# Patient Record
Sex: Male | Born: 1965 | Race: White | Hispanic: No | Marital: Married | Smoking: Former smoker
Health system: Southern US, Community
[De-identification: ages and names within clinical notes are randomized; demographics above are authoritative.]

## PROBLEM LIST (undated history)

## (undated) DIAGNOSIS — N390 Urinary tract infection, site not specified: Secondary | ICD-10-CM

## (undated) DIAGNOSIS — M1990 Unspecified osteoarthritis, unspecified site: Secondary | ICD-10-CM

## (undated) DIAGNOSIS — R197 Diarrhea, unspecified: Secondary | ICD-10-CM

## (undated) DIAGNOSIS — Z87442 Personal history of urinary calculi: Secondary | ICD-10-CM

## (undated) DIAGNOSIS — J189 Pneumonia, unspecified organism: Secondary | ICD-10-CM

## (undated) DIAGNOSIS — C189 Malignant neoplasm of colon, unspecified: Secondary | ICD-10-CM

## (undated) DIAGNOSIS — K449 Diaphragmatic hernia without obstruction or gangrene: Secondary | ICD-10-CM

## (undated) DIAGNOSIS — K219 Gastro-esophageal reflux disease without esophagitis: Secondary | ICD-10-CM

## (undated) DIAGNOSIS — Z9889 Other specified postprocedural states: Secondary | ICD-10-CM

## (undated) HISTORY — DX: Personal history of urinary calculi: Z87.442

## (undated) HISTORY — DX: Unspecified osteoarthritis, unspecified site: M19.90

## (undated) HISTORY — PX: COLONOSCOPY W/ POLYPECTOMY: SHX1380

## (undated) HISTORY — DX: Other specified postprocedural states: Z98.890

## (undated) HISTORY — DX: Gastro-esophageal reflux disease without esophagitis: K21.9

## (undated) HISTORY — DX: Diaphragmatic hernia without obstruction or gangrene: K44.9

## (undated) HISTORY — PX: SKIN CANCER EXCISION: SHX779

## (undated) HISTORY — DX: Urinary tract infection, site not specified: N39.0

## (undated) HISTORY — DX: Pneumonia, unspecified organism: J18.9

## (undated) HISTORY — DX: Diarrhea, unspecified: R19.7

## (undated) HISTORY — DX: Malignant neoplasm of colon, unspecified: C18.9

---

## 2007-02-11 ENCOUNTER — Encounter: Admission: RE | Admit: 2007-02-11 | Discharge: 2007-02-11 | Payer: Self-pay | Admitting: Orthopedic Surgery
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
## 2018-05-29 ENCOUNTER — Ambulatory Visit (INDEPENDENT_AMBULATORY_CARE_PROVIDER_SITE_OTHER): Payer: Self-pay

## 2018-05-29 ENCOUNTER — Ambulatory Visit (INDEPENDENT_AMBULATORY_CARE_PROVIDER_SITE_OTHER): Payer: Self-pay | Admitting: Orthopedic Surgery

## 2018-05-29 ENCOUNTER — Encounter (INDEPENDENT_AMBULATORY_CARE_PROVIDER_SITE_OTHER): Payer: Self-pay | Admitting: Orthopedic Surgery

## 2018-05-29 DIAGNOSIS — M25512 Pain in left shoulder: Secondary | ICD-10-CM

## 2018-05-29 DIAGNOSIS — M25511 Pain in right shoulder: Secondary | ICD-10-CM

## 2018-05-29 DIAGNOSIS — M7551 Bursitis of right shoulder: Secondary | ICD-10-CM

## 2018-06-02 ENCOUNTER — Encounter (INDEPENDENT_AMBULATORY_CARE_PROVIDER_SITE_OTHER): Payer: Self-pay | Admitting: Orthopedic Surgery

## 2018-06-02 DIAGNOSIS — M7551 Bursitis of right shoulder: Secondary | ICD-10-CM

## 2018-06-02 MED ORDER — LIDOCAINE HCL 1 % IJ SOLN
5.0000 mL | INTRAMUSCULAR | Status: AC | PRN
  Administered 2018-06-02: 5 mL

## 2018-06-02 MED ORDER — METHYLPREDNISOLONE ACETATE 40 MG/ML IJ SUSP
40.0000 mg | INTRAMUSCULAR | Status: AC | PRN
  Administered 2018-06-02: 40 mg via INTRA_ARTICULAR

## 2018-06-02 MED ORDER — BUPIVACAINE HCL 0.5 % IJ SOLN
9.0000 mL | INTRAMUSCULAR | Status: AC | PRN
  Administered 2018-06-02: 9 mL via INTRA_ARTICULAR

## 2018-06-02 NOTE — Progress Notes (Signed)
Office Visit Note   Patient: Robert Mitchell           Date of Birth: 07/22/1965           MRN: [id number] Visit Date: 05/29/2018 Requested by: No referring provider defined for this encounter. PCP: Hadley Pen, MD  Subjective: Chief Complaint  Patient presents with  . Right Shoulder - Pain  . Left Shoulder - Pain    HPI: Robert Mitchell is a patient with bilateral shoulder pain right worse than left.  Denies a history of injury.  He has had it for long time.  Had cortisone injection in the subacromial space 12 years ago which helped him.  He reports some popping and occasional radiation of pain below the elbow but no discrete radicular symptoms from the neck.  Hurts for him to reach behind his back.  He is not taking any pain medicine for the problem.              ROS: All systems reviewed are negative as they relate to the chief complaint within the history of present illness.  Patient denies  fevers or chills.   Assessment & Plan: Visit Diagnoses:  1. Acute pain of left shoulder   2. Acute pain of right shoulder     Plan: Impression is bilateral shoulder pain right worse than left..  No evidence of rotator cuff weakness or frozen shoulder.  Impingement likely.  Radiographs normal.  Plan is subacromial injection today with follow-up in 6 to 8 weeks if no better.  Could consider MRI scanning at that time.  Follow-Up Instructions: Return if symptoms worsen or fail to improve.   Orders:  Orders Placed This Encounter  Procedures  . XR Shoulder Right  . XR Shoulder Left   No orders of the defined types were placed in this encounter.     Procedures: Large Joint Inj: R subacromial bursa on 06/02/2018 4:23 PM Indications: diagnostic evaluation and pain Details: 18 G 1.5 in needle, posterior approach  Arthrogram: No  Medications: 9 mL bupivacaine 0.5 %; 40 mg methylPREDNISolone acetate 40 MG/ML; 5 mL lidocaine 1 % Outcome: tolerated well, no immediate complications Procedure,  treatment alternatives, risks and benefits explained, specific risks discussed. Consent was given by the patient. Immediately prior to procedure a time out was called to verify the correct patient, procedure, equipment, support staff and site/side marked as required. Patient was prepped and draped in the usual sterile fashion.       Clinical Data: No additional findings.  Objective: Vital Signs: There were no vitals taken for this visit.  Physical Exam:   Constitutional: Patient appears well-developed HEENT:  Head: Normocephalic Eyes:EOM are normal Neck: Normal range of motion Cardiovascular: Normal rate Pulmonary/chest: Effort normal Neurologic: Patient is alert Skin: Skin is warm Psychiatric: Patient has normal mood and affect    Ortho Exam: Ortho exam demonstrates full active and passive range of motion of both shoulders.  Impingement signs positive on the right and left.  Good rotator cuff strength isolated Espina supraspinatus subscap muscle testing.  No masses lymphadenopathy or skin change in the shoulder girdle region.  Cervical spine range of motion is full.  No paresthesias C5-T1.  Reflexes symmetric and radial pulses intact.  Specialty Comments:  No specialty comments available.  Imaging: No results found.   PMFS History: There are no active problems to display for this patient.  History reviewed. No pertinent past medical history.  History reviewed. No pertinent family history.  History reviewed.  No pertinent surgical history. Social History   Occupational History  . Not on file  Tobacco Use  . Smoking status: Never Smoker  . Smokeless tobacco: Current User    Types: Chew  Substance and Sexual Activity  . Alcohol use: Not on file  . Drug use: Not on file  . Sexual activity: Not on file

## 2018-08-07 ENCOUNTER — Telehealth (INDEPENDENT_AMBULATORY_CARE_PROVIDER_SITE_OTHER): Payer: Self-pay | Admitting: Orthopedic Surgery

## 2018-08-07 NOTE — Telephone Encounter (Signed)
Can you help with this?

## 2018-08-07 NOTE — Telephone Encounter (Signed)
Patient's wife called yesterday and stated that they received a bill that he was seen two different times.  One office visit on 05/29/18 and 06/02/2018.  Billing is charging them two different office visits instead of just the one for 05/29/2018.  Billing advised them to call our office to get the dates corrected.  [phone].  Thank you.

## 2018-08-08 ENCOUNTER — Telehealth (INDEPENDENT_AMBULATORY_CARE_PROVIDER_SITE_OTHER): Payer: Self-pay

## 2018-08-08 DIAGNOSIS — M25512 Pain in left shoulder: Secondary | ICD-10-CM

## 2018-08-08 NOTE — Telephone Encounter (Signed)
Patients wife called stating patient is having increased pain. Previously had shoulder asp/injected in January. Wants to know what next step is? She wanted to know if there is any suggestion of things that he could use to get relief. He is not sleeping at night. Pain worse over the last couple of weeks Contact number  [phone]

## 2018-08-12 NOTE — Telephone Encounter (Signed)
Needs MRI arthrogram on the affected shoulder.  Please call to arrange.  Thanks

## 2018-08-12 NOTE — Telephone Encounter (Signed)
Put order in for scan Patients wife aware will be contacted to get scheduled. Advised may not be scheduled for a few weeks due to virus but order had been put in.

## 2018-08-12 NOTE — Addendum Note (Signed)
Addended byPrescott Parma on: 08/12/2018 02:21 PM   Modules accepted: Orders

## 2018-08-23 ENCOUNTER — Other Ambulatory Visit (INDEPENDENT_AMBULATORY_CARE_PROVIDER_SITE_OTHER): Payer: Self-pay | Admitting: Orthopedic Surgery

## 2018-08-23 DIAGNOSIS — Z77018 Contact with and (suspected) exposure to other hazardous metals: Secondary | ICD-10-CM

## 2018-10-08 ENCOUNTER — Ambulatory Visit
Admission: RE | Admit: 2018-10-08 | Discharge: 2018-10-08 | Disposition: A | Discharge status: Home / Self Care | Payer: Self-pay | Source: Ambulatory Visit | Attending: Orthopedic Surgery | Admitting: Orthopedic Surgery

## 2018-10-08 ENCOUNTER — Other Ambulatory Visit: Payer: Self-pay

## 2018-10-08 DIAGNOSIS — M25512 Pain in left shoulder: Secondary | ICD-10-CM

## 2018-10-08 DIAGNOSIS — Z77018 Contact with and (suspected) exposure to other hazardous metals: Secondary | ICD-10-CM

## 2018-11-05 ENCOUNTER — Ambulatory Visit
Admission: RE | Admit: 2018-11-05 | Discharge: 2018-11-05 | Disposition: A | Discharge status: Home / Self Care | Payer: Self-pay | Source: Ambulatory Visit | Attending: Orthopedic Surgery | Admitting: Orthopedic Surgery

## 2018-11-05 ENCOUNTER — Other Ambulatory Visit (INDEPENDENT_AMBULATORY_CARE_PROVIDER_SITE_OTHER): Payer: Self-pay | Admitting: Orthopedic Surgery

## 2018-11-05 ENCOUNTER — Other Ambulatory Visit: Payer: Self-pay

## 2018-11-05 DIAGNOSIS — M25512 Pain in left shoulder: Secondary | ICD-10-CM

## 2018-11-05 DIAGNOSIS — M25511 Pain in right shoulder: Secondary | ICD-10-CM

## 2018-11-05 MED ORDER — IOPAMIDOL (ISOVUE-M 200) INJECTION 41%
12.0000 mL | Freq: Once | INTRAMUSCULAR | Status: AC
  Administered 2018-11-05: 17:00:00 12 mL via INTRA_ARTICULAR

## 2018-11-22 ENCOUNTER — Telehealth: Payer: Self-pay | Admitting: Orthopedic Surgery

## 2018-11-22 NOTE — Telephone Encounter (Signed)
Please advise thanks.

## 2018-11-22 NOTE — Telephone Encounter (Signed)
Patient's spouse called very upset that patient has not been called about MRI results. Advised spouse that our normal protocol is telling the patient at check out to call us back after given a date to schedule a MRI REVIEW. She said Dr. Marlou Sa has always called them with results.  Please call this p[atient to advise what to do about the results. I apologized to spouse for the delay of results.  Patients # [id number] or [phone]

## 2018-11-25 NOTE — Telephone Encounter (Signed)
I called and left a message on the spouse's number but the [phone] number has some type of weird mailbox thing that I could not decipher

## 2019-01-29 ENCOUNTER — Ambulatory Visit: Payer: Self-pay | Admitting: Orthopedic Surgery

## 2019-01-30 ENCOUNTER — Ambulatory Visit (INDEPENDENT_AMBULATORY_CARE_PROVIDER_SITE_OTHER): Payer: Self-pay | Admitting: Orthopedic Surgery

## 2019-01-30 ENCOUNTER — Other Ambulatory Visit: Payer: Self-pay

## 2019-01-30 ENCOUNTER — Encounter: Payer: Self-pay | Admitting: Orthopedic Surgery

## 2019-01-30 ENCOUNTER — Telehealth: Payer: Self-pay

## 2019-01-30 DIAGNOSIS — M19012 Primary osteoarthritis, left shoulder: Secondary | ICD-10-CM

## 2019-01-30 DIAGNOSIS — M19011 Primary osteoarthritis, right shoulder: Secondary | ICD-10-CM

## 2019-01-30 MED ORDER — MELOXICAM 15 MG PO TABS
15.0000 mg | ORAL_TABLET | Freq: Every day | ORAL | 0 refills | Status: DC
Start: 2019-01-30 — End: 2019-02-23

## 2019-01-30 NOTE — Telephone Encounter (Signed)
Patient was seen today. He mentioned that at his last OV he was charged for ultrasound guided injection. Can you please check and see if this is correct?

## 2019-01-31 ENCOUNTER — Encounter: Payer: Self-pay | Admitting: Orthopedic Surgery

## 2019-01-31 NOTE — Progress Notes (Signed)
Office Visit Note   Patient: Robert Mitchell           Date of Birth: 04/14/1966           MRN: [id number] Visit Date: 01/30/2019 Requested by: Myrlene Broker, MD [place],   [id number] PCP: Myrlene Broker, MD  Subjective: No chief complaint on file.   HPI: Robert Mitchell is a 53 y.o. male who presents to the office complaining of right shoulder pain.  He returns to discuss MRI results.  He injured the right shoulder months ago when he fell onto it.  He had no right shoulder pain prior to the injury.  Patient notes decreased range of motion, weakness.  He notes diffuse right shoulder pain that radiates to the elbow but never to the fingers.  Pain wakes him up at night and he is trying to control the pain with ibuprofen and Tylenol.  He denies any neck pain, numbness tingling, burning.  He also notes clicking and grinding of the shoulder with range of motion.  He has had 1 recent injection into the subacromial space of the right shoulder about 6 months ago that provided no relief.  He also notes about 6 months of pain in the left shoulder with similar symptoms.  He denies any injury but states it was an acute onset.              ROS:  All systems reviewed are negative as they relate to the chief complaint within the history of present illness.  Patient denies fevers or chills.  Assessment & Plan: Visit Diagnoses:  1. Primary osteoarthritis, right shoulder   2. Primary osteoarthritis, left shoulder     Plan: Patient is a 53 year old male who presents complaining of bilateral shoulder pain, right greater than left.  He returns to discuss MRI results.  MRI of the right shoulder on 11/05/2018 reveals mild to moderate rotator cuff tendinopathy with no partial or full thickness tear, posterior labral tear, degenerative superior labrum, advanced glenohumeral joint degeneration, with intact long head biceps tendon and glenohumeral ligaments.  Discussed options available  to the patient.  Patient is likely heading for a shoulder replacement later in his lifetime.  He has excellent strength and good range of motion passively on exam aside from external rotation which is limited.  Prescribed meloxicam.  Patient will follow-up with Dr. Junius Roads in about a week for bilateral shoulder injections into the glenohumeral joint itself.  Patient agrees with the plan.  Follow-Up Instructions: No follow-ups on file.   Orders:  No orders of the defined types were placed in this encounter.  Meds ordered this encounter  Medications  . meloxicam (MOBIC) 15 MG tablet    Sig: Take 1 tablet (15 mg total) by mouth daily.    Dispense:  30 tablet    Refill:  0      Procedures: No procedures performed   Clinical Data: No additional findings.  Objective: Vital Signs: There were no vitals taken for this visit.  Physical Exam:  Constitutional: Patient appears well-developed HEENT:  Head: Normocephalic Eyes:EOM are normal Neck: Normal range of motion Cardiovascular: Normal rate Pulmonary/chest: Effort normal Neurologic: Patient is alert Skin: Skin is warm Psychiatric: Patient has normal mood and affect  Ortho Exam:  Bilateral shoulder Exam Able to fully forward flex and abduct shoulder overhead with pain Significant bilateral reduction of external rotation range of motion.  Good endpoint with ER No TTP over the [facility]  joint Mild TTP over the bicipital groove 5/5 motor strength of the subscapularis, supraspinatus, infraspinatus muscles Negative Hawkins impingement 5/5 grip strength, forearm pronation/supination, and bicep strength  Specialty Comments:  No specialty comments available.  Imaging: No results found.   PMFS History: There are no active problems to display for this patient.  History reviewed. No pertinent past medical history.  History reviewed. No pertinent family history.  History reviewed. No pertinent surgical history. Social History    Occupational History  . Not on file  Tobacco Use  . Smoking status: Never Smoker  . Smokeless tobacco: Current User    Types: Chew  Substance and Sexual Activity  . Alcohol use: Not on file  . Drug use: Not on file  . Sexual activity: Not on file

## 2019-01-31 NOTE — Telephone Encounter (Signed)
Okay.  Thanks for checking into it.  I asked him to bring his bill in so we can look at it.  There is definitely a discrepancy between what was charged and what is in the notes according to him.

## 2019-01-31 NOTE — Telephone Encounter (Signed)
See note from Abigail Butts she looked into and according to charges, there was no charge for ultrasound.  IC LMVM for patient advising him of this as well.

## 2019-01-31 NOTE — Telephone Encounter (Signed)
His charges from Jan 2020 were-  [id number]  [id number]  [id number]  [id number] x 2  There was no ultrasound charged.

## 2019-02-07 ENCOUNTER — Ambulatory Visit: Payer: Self-pay

## 2019-02-07 ENCOUNTER — Encounter: Payer: Self-pay | Admitting: Family Medicine

## 2019-02-07 ENCOUNTER — Other Ambulatory Visit: Payer: Self-pay

## 2019-02-07 ENCOUNTER — Ambulatory Visit (INDEPENDENT_AMBULATORY_CARE_PROVIDER_SITE_OTHER): Payer: Self-pay | Admitting: Family Medicine

## 2019-02-07 DIAGNOSIS — M19011 Primary osteoarthritis, right shoulder: Secondary | ICD-10-CM

## 2019-02-07 DIAGNOSIS — M19012 Primary osteoarthritis, left shoulder: Secondary | ICD-10-CM

## 2019-02-07 NOTE — Progress Notes (Signed)
Subjective: Patient is here for ultrasound-guided intra-articular bilateral glenohumeral injection.  He has advanced glenohumeral DJD.  Minimal improvement with subacromial injection.  Objective: He has pain at the extremes of range of motion in both shoulders.  Procedure: Ultrasound-guided bilateral glenohumeral injection: After sterile prep with Betadine, injected 8 cc 1% lidocaine without epinephrine and 40 mg methylprednisolone using a 22-gauge spinal needle, passing the needle through approach into the glenohumeral joint.  Injectate was seen filling the joint capsules.  He had very good immediate pain relief as well as improved range of motion.  He will follow-up with Dr. Marlou Sa as directed.  Future options could include gel injections, PRP, prolotherapy with dextrose.

## 2019-02-23 ENCOUNTER — Other Ambulatory Visit: Payer: Self-pay | Admitting: Surgical

## 2019-02-23 NOTE — Telephone Encounter (Signed)
Ok to rf? 

## 2019-03-25 ENCOUNTER — Other Ambulatory Visit: Payer: Self-pay | Admitting: Surgical

## 2019-03-25 NOTE — Telephone Encounter (Signed)
Please advise, thank you.

## 2019-04-08 ENCOUNTER — Other Ambulatory Visit: Payer: Self-pay

## 2019-04-08 ENCOUNTER — Encounter: Payer: Self-pay | Admitting: Family Medicine

## 2019-04-08 ENCOUNTER — Ambulatory Visit (INDEPENDENT_AMBULATORY_CARE_PROVIDER_SITE_OTHER): Payer: Self-pay | Admitting: Family Medicine

## 2019-04-08 DIAGNOSIS — M19012 Primary osteoarthritis, left shoulder: Secondary | ICD-10-CM

## 2019-04-08 DIAGNOSIS — M19011 Primary osteoarthritis, right shoulder: Secondary | ICD-10-CM

## 2019-04-08 NOTE — Progress Notes (Signed)
   Office Visit Note   Patient: Robert Mitchell           Date of Birth: Dec 12, 1965           MRN: [id number] Visit Date: 04/08/2019 Requested by: Myrlene Broker, MD [place],  [place] [address] PCP: Myrlene Broker, MD  Subjective: Chief Complaint  Patient presents with  . Right Shoulder - Pain    Wants to discuss options for the shoulders. Only had 2 days' relief with the glenohumeral injections on 02/07/19.  Marland Kitchen Left Shoulder - Pain    HPI: He is here for follow-up bilateral shoulder glenohumeral DJD.  Intra-articular injections gave only 2 days relief.  He does not have health insurance.  He recently met with a local clinic that provides stem cell injections.  He wanted to get my opinion on this.               ROS:   All other systems were reviewed and are negative.  Objective: Vital Signs: There were no vitals taken for this visit.  Physical Exam:  General:  Alert and oriented, in no acute distress. Pulm:  Breathing unlabored. Psy:  Normal mood, congruent affect.  No exam done today.  Imaging: None today  Assessment & Plan: 1.  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  We do not offer stem cell injections because studies have not proven their effectiveness.  Regardless, he thinks he plans to try them. -We discussed other options including hyaluronic acid injections and prolotherapy with dextrose. -I will see him back as needed.     Procedures: No procedures performed  No notes on file     PMFS History: There are no active problems to display for this patient.  History reviewed. No pertinent past medical history.  History reviewed. No pertinent family history.  History reviewed. No pertinent surgical history. Social History   Occupational History  . Not on file  Tobacco Use  . Smoking status: Never Smoker  . Smokeless tobacco: Current User    Types: Chew   Substance and Sexual Activity  . Alcohol use: Not on file  . Drug use: Not on file  . Sexual activity: Not on file

## 2019-04-23 ENCOUNTER — Other Ambulatory Visit: Payer: Self-pay | Admitting: Surgical

## 2019-04-23 NOTE — Telephone Encounter (Signed)
This is a MH pt.  

## 2019-05-20 ENCOUNTER — Other Ambulatory Visit: Payer: Self-pay | Admitting: Family Medicine

## 2020-06-18 IMAGING — XA FLUORO GUIDED NEEDLE PLACEMENT AND/OR ASPIRATION
1 series · 1 of 1 positions shown · non-contrast
Comparison: none

CLINICAL DATA: Right shoulder pain.

[Series 1: ortho standard · 1 of 1 slices shown]
[im 1/1]
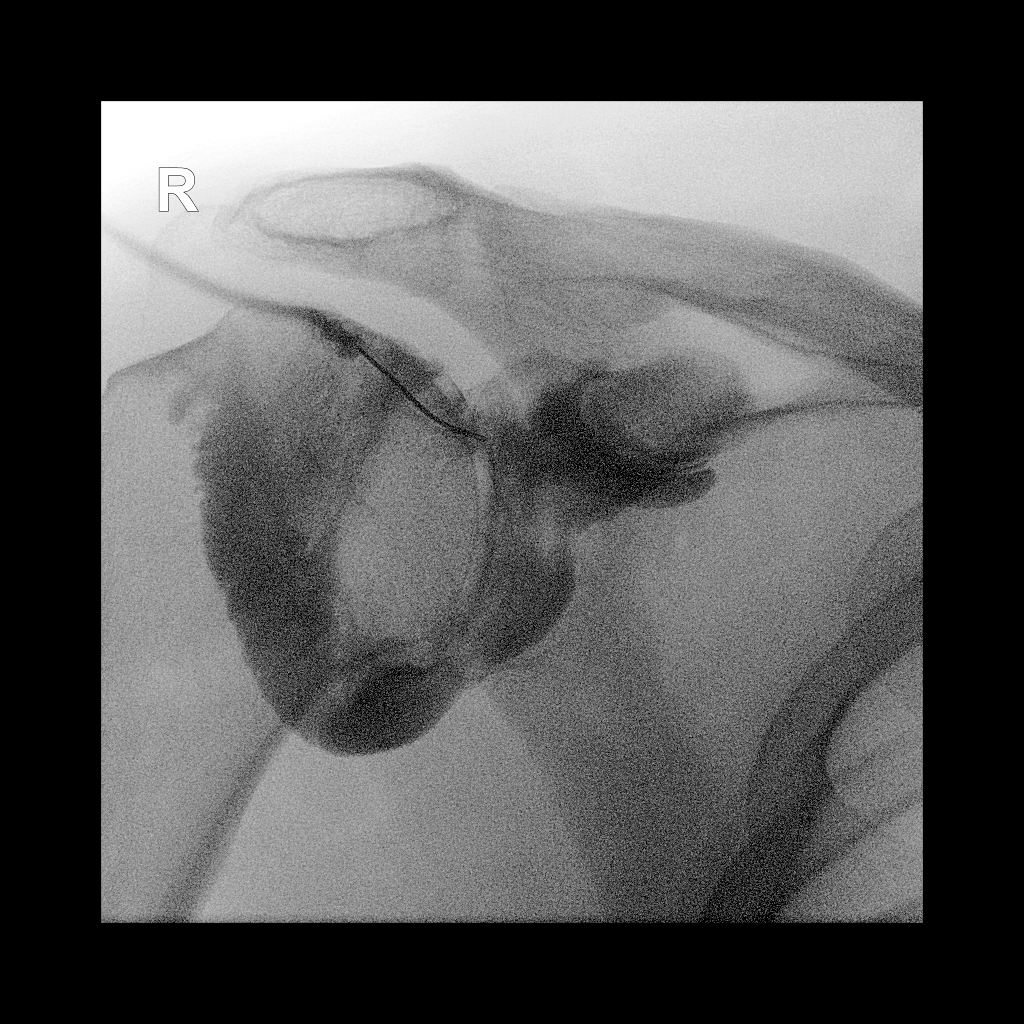

[1 of 1 positions shown; findings below may reference images not displayed]

FLUOROSCOPY TIME:  Radiation Exposure Index (as provided by the
fluoroscopic device): 0.6 mGy

Fluoroscopy Time:  4 seconds

Number of Acquired Images:  0

PROCEDURE:
The risks and benefits of the procedure were discussed with the
patient, and written informed consent was obtained. The patient
stated no history of allergy to contrast media. A formal timeout
procedure was performed with the patient according to departmental
protocol.

The patient was placed supine on the fluoroscopy table and the right
glenohumeral joint was identified under fluoroscopy. The skin
overlying the right glenohumeral joint was subsequently cleaned with
Betadine and a sterile drape was placed over the area of interest. 2
ml 1% Lidocaine was used to anesthetize the skin around the needle
insertion site.

A 22 gauge spinal needle was inserted into the right glenohumeral
joint under fluoroscopy.

12 ml of gadolinium mixture (0.1 ml of Multihance mixed with 10 ml
of Isovue-M 200 contrast and 10 ml of sterile saline) were injected
into the right glenohumeral joint.

The needle was removed and hemostasis was achieved. The patient was
subsequently transferred to MRI for imaging.
IMPRESSION: Technically successful right shoulder injection for MRI.

## 2020-07-16 ENCOUNTER — Other Ambulatory Visit: Payer: Self-pay | Admitting: Family Medicine

## 2020-08-16 ENCOUNTER — Other Ambulatory Visit: Payer: Self-pay | Admitting: Family Medicine

## 2023-10-15 ENCOUNTER — Other Ambulatory Visit (INDEPENDENT_AMBULATORY_CARE_PROVIDER_SITE_OTHER): Payer: Self-pay

## 2023-10-15 ENCOUNTER — Encounter: Payer: Self-pay | Admitting: Physician Assistant

## 2023-10-15 ENCOUNTER — Ambulatory Visit: Payer: Self-pay | Admitting: Physician Assistant

## 2023-10-15 VITALS — BP 120/80 | HR 90 | Ht 67.0 in | Wt 183.4 lb

## 2023-10-15 DIAGNOSIS — Z800 Family history of malignant neoplasm of digestive organs: Secondary | ICD-10-CM

## 2023-10-15 DIAGNOSIS — R197 Diarrhea, unspecified: Secondary | ICD-10-CM

## 2023-10-15 DIAGNOSIS — D509 Iron deficiency anemia, unspecified: Secondary | ICD-10-CM

## 2023-10-15 DIAGNOSIS — Z83719 Family history of colon polyps, unspecified: Secondary | ICD-10-CM

## 2023-10-15 DIAGNOSIS — R159 Full incontinence of feces: Secondary | ICD-10-CM

## 2023-10-15 DIAGNOSIS — K921 Melena: Secondary | ICD-10-CM

## 2023-10-15 DIAGNOSIS — K625 Hemorrhage of anus and rectum: Secondary | ICD-10-CM

## 2023-10-15 LAB — COMPREHENSIVE METABOLIC PANEL WITH GFR
ALT: 7 U/L (ref 0–53)
AST: 9 U/L (ref 0–37)
Albumin: 4.1 g/dL (ref 3.5–5.2)
Alkaline Phosphatase: 54 U/L (ref 39–117)
BUN: 12 mg/dL (ref 6–23)
CO2: 30 meq/L (ref 19–32)
Calcium: 9.1 mg/dL (ref 8.4–10.5)
Chloride: 103 meq/L (ref 96–112)
Creatinine, Ser: 1.16 mg/dL (ref 0.40–1.50)
GFR: 70.37 mL/min (ref >60.00)
Glucose, Bld: 86 mg/dL (ref 70–99)
Potassium: 3.8 meq/L (ref 3.5–5.1)
Sodium: 139 meq/L (ref 135–145)
Total Bilirubin: 0.4 mg/dL (ref 0.2–1.2)
Total Protein: 6.9 g/dL (ref 6.0–8.3)

## 2023-10-15 LAB — CBC WITH DIFFERENTIAL/PLATELET
Basophils Absolute: 0 10*3/uL (ref 0.0–0.1)
Basophils Relative: 0.6 % (ref 0.0–3.0)
Eosinophils Absolute: 0.1 10*3/uL (ref 0.0–0.7)
Eosinophils Relative: 2 % (ref 0.0–5.0)
HCT: 44.5 % (ref 39.0–52.0)
Hemoglobin: 14.5 g/dL (ref 13.0–17.0)
Lymphocytes Relative: 30.6 % (ref 12.0–46.0)
Lymphs Abs: 2 10*3/uL (ref 0.7–4.0)
MCHC: 32.7 g/dL (ref 30.0–36.0)
MCV: 84 fl (ref 78.0–100.0)
Monocytes Absolute: 0.4 10*3/uL (ref 0.1–1.0)
Monocytes Relative: 6.8 % (ref 3.0–12.0)
Neutro Abs: 3.8 10*3/uL (ref 1.4–7.7)
Neutrophils Relative %: 60 % (ref 43.0–77.0)
Platelets: 307 10*3/uL (ref 150.0–400.0)
RBC: 5.3 Mil/uL (ref 4.22–5.81)
RDW: 14.4 % (ref 11.5–15.5)
WBC: 6.4 10*3/uL (ref 4.0–10.5)

## 2023-10-15 LAB — IBC + FERRITIN
Ferritin: 5.6 ng/mL — ABNORMAL LOW (ref 22.0–322.0)
Iron: 28 ug/dL — ABNORMAL LOW (ref 42–165)
Saturation Ratios: 6.2 % — ABNORMAL LOW (ref 20.0–50.0)
TIBC: 452.2 ug/dL — ABNORMAL HIGH (ref 250.0–450.0)
Transferrin: 323 mg/dL (ref 212.0–360.0)

## 2023-10-15 LAB — SEDIMENTATION RATE: Sed Rate: 20 mm/h (ref 0–20)

## 2023-10-15 MED ORDER — NA SULFATE-K SULFATE-MG SULF 17.5-3.13-1.6 GM/177ML PO SOLN: 1.0000 | Freq: Once | ORAL | 0 refills | Status: AC

## 2023-10-15 NOTE — Patient Instructions (Addendum)
 Your provider has requested that you go to the basement level for lab work before leaving today. Press B on the elevator. The lab is located at the first door on the left as you exit the elevator.  - Drink a lot of liquids that have water, salt, and sugar. Good choices are water mixed with juice, flavored soda, and soup broth. If you are drinking enough, your urine will be light yellow or almost clear.  - Try to eat a little food. Good choices are potatoes, noodles, rice, oatmeal, crackers, bananas, soup, and boiled vegetables.  - Avoid high fat foods, as they can make diarrhea worse.  - Dairy products (except yogurt) may be difficult to digest when you have diarrhea. I recommend that you temporarily avoid lactose-containing foods.  - Can try loperamide 4 mg initially, then 2 mg after each unformed stool for =2 days, with a maximum of 16 mg/day.  - If loperamide is not working, you could try bismuth salicylate (Pepto-Bismol) 30 mL or two tablets every 30 minutes for eight doses. Pepto-Bismol may make your stools black.   Go to the ER if any severe abdominal pain, fever, or weakness  First do a trial off milk/lactose products if you use them.  Add fiber like benefiber or citracel once a day Increase activity Can do trial of IBGard which is over the counter for AB pain- Take 1-2 capsules once a day for maintence or twice a day during a flare Please try to decrease stress. consider talking with PCP about anti anxiety medication or try head space app for meditation. if any worsening symptoms like blood in stool, weight loss, please call the office     FODMAP stands for fermentable oligo-, di-, mono-saccharides and polyols (1). These are the scientific terms used to classify groups of carbs that are difficult for our body to digest and that are notorious for triggering digestive symptoms like bloating, gas, loose stools and stomach pain.   You can try low FODMAP diet  - start with eliminating  just one column at a time that you feel may be a trigger for you. - the table at the very bottom contains foods that are low in FODMAPs   Sometimes trying to eliminate the FODMAP's from your diet is difficult or tricky, if you are stuggling with trying to do the elimination diet you can try an enzyme.  There is a food enzymes that you sprinkle in or on your food that helps break down the FODMAP. You can read more about the enzyme by going to this site: https://fodzyme.com/  You have been scheduled for a colonoscopy. Please follow written instructions given to you at your visit today.   If you use inhalers (even only as needed), please bring them with you on the day of your procedure.  DO NOT TAKE 7 DAYS PRIOR TO TEST- Trulicity (dulaglutide) Ozempic, Wegovy (semaglutide) Mounjaro (tirzepatide) Bydureon Bcise (exanatide extended release)  DO NOT TAKE 1 DAY PRIOR TO YOUR TEST Rybelsus (semaglutide) Adlyxin (lixisenatide) Victoza (liraglutide) Byetta (exanatide) ___________________________________________________________________________  Due to recent changes in healthcare laws, you may see the results of your imaging and laboratory studies on MyChart before your provider has had a chance to review them.  We understand that in some cases there may be results that are confusing or concerning to you. Not all laboratory results come back in the same time frame and the provider may be waiting for multiple results in order to interpret others.  Please give  us  48 hours in order for your provider to thoroughly review all the results before contacting the office for clarification of your results.

## 2023-10-15 NOTE — Progress Notes (Signed)
 10/15/2023 Robert Mitchell [id number] 11-Jun-1965  Referring provider: Melva Stabile, MD Primary GI doctor: Dr. Venice Gillis  ASSESSMENT AND PLAN:  2 months for fecal incontinence with urination/relaxation/gas, twice a day but the up to 7 x a day, started on imodium and this helped Lost 15 lbs weight loss during that time He last took imodium yesterday, had normal BM today No AB pain PGM colon cancer age 58, father with colon polyps sister with collagenous colitis Never had a colonoscopy Chronic rectal bleeding with fresh red blood, intermittent melena, and diarrhea since teenage years, recently exacerbated. Differential includes hemorrhoids, collagenous colitis, and potential colon cancer. Melena suggests possible upper GI bleeding. Family history of colon cancer and polyps increases colorectal pathology risk. Colonoscopy necessary for evaluation. Cologuard not indicated due to high false positives. - Order colonoscopy at [facility], We have discussed the risks of bleeding, infection, perforation, medication reactions, and remote risk of death associated with colonoscopy. All questions were answered and the patient acknowledges these risk and wishes to proceed.. - Order stool studies for H. Pylori since patient declines EGD - order stool C. difficile. - Order blood tests including CBC, iron studies, and celiac panel. - Prescribe Calmol 4 suppositories and hemorrhoid cream. - Advise continuation of loperamide as needed, max 16 mg/day.  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 Topical treatments discussed. - Prescribe Calmol 4 suppositories and hemorrhoid cream.  Black stools x 2-3 with history of GERD but resolved today, history of GERD severe but last 2 months this resolved, no dysphagia, no nausea, vomiting Once every 3-4 months since he was a teenager No pepto/iron No NSAIDS, no ETOH, no drug use - declines EGD -  check for anemia, consider EGD if shows anemia - check H pylori and celiac - add on pantoprazole 40 mg once daily  Patient Care Team: Melva Stabile, MD as PCP - General (Family Medicine)  HISTORY OF PRESENT ILLNESS: 58 y.o. self-pay male with a past medical history listed below presents for evaluation of abdominal pain.   No labs or imaging to review.  Discussed the use of AI scribe software for clinical note transcription with the patient, who gave verbal consent to proceed.  History of Present Illness   Robert Mitchell is a 58 year old male who presents with changes in bowel habits, rectal bleeding, and weight loss.  Over the past two months, he has experienced significant changes in bowel habits, including diarrhea and rectal bleeding. The diarrhea progressed from once daily to seven to eight times a day, resulting in a weight loss of 15 to 20 pounds. He uses Imodium to manage the diarrhea, which is effective but causes nausea and lethargy. His bowel movements have always been loose, described as 'kind of like peanut butter', and never solid since childhood.  He reports intermittent episodes of melena since his teenage years, occurring every three to four months. Recently, he had melena for three days, which resolved spontaneously. He denies the use of Pepto Bismol, iron supplements, or any medications that could cause melena. He has a history of chronic heartburn and indigestion, which resolved two months ago without medication.  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 He also reports a slimy, clear liquid leakage from the rectum for the past few years, requiring the use of paper towels for management. No abdominal  pain, bloating, or gas.  He has experienced fecal incontinence over the past two months, particularly when relaxing to urinate, which began suddenly while standing in a store. This has improved  recently, and he reports a normal bowel movement today.  No trouble swallowing, nausea, or vomiting. He reports night sweats for the past six months and occasional dizziness when standing up too quickly.  His current medications include testosterone, which he has been taking on and off for the last 30 years, currently at a dose of half a cc weekly for the past three years. He denies the use of Aleve, ibuprofen, Goody powders, alcohol, or recreational drugs.  There is a family history of colon cancer in his paternal grandmother, diagnosed at age 75. His father has had non-cancerous polyps removed, and his sister has collagenous colitis and diverticulosis.  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        He  reports that he has never smoked. His smokeless tobacco use includes chew. No history on file for alcohol use and drug use.  RELEVANT GI HISTORY, IMAGING AND LABS: Results          CBC No results found for: WBC, RBC, HGB, HCT, PLT, MCV, MCH, MCHC, RDW, LYMPHSABS, MONOABS, EOSABS, BASOSABS No results for input(s): HGB in the last 8760 hours.  CMP  No results found for: NA, K, CL, CO2, GLUCOSE, BUN, CREATININE, CALCIUM, PROT, ALBUMIN, AST, ALT, ALKPHOS, BILITOT, GFRNONAA, GFRAA     No data to display            Current Medications:   Current Outpatient Medications (Endocrine & Metabolic):    testosterone cypionate (DEPOTESTOSTERONE CYPIONATE) 200 MG/ML injection, Inject 200 mg into the muscle once a week.    Current Outpatient Medications (Analgesics):    meloxicam  (MOBIC ) 15 MG tablet, TAKE 1 TABLET BY MOUTH EVERY DAY   Current Outpatient Medications (Other):    loperamide (IMODIUM A-D) 2 MG tablet, Take 2 mg by mouth 4 (four) times daily as needed for diarrhea or loose stools.   Na Sulfate-K Sulfate-Mg Sulfate concentrate  (SUPREP BOWEL PREP KIT) 17.5-3.13-1.6 GM/177ML SOLN, Take 1 kit (354 mLs total) by mouth once for 1 dose.   Medical History:  Past Medical History:  Diagnosis Date   Arthritis    Pneumonia    UTI (urinary tract infection)    Allergies: No Known Allergies   Surgical History:  He  has no past surgical history on file. Family History:  His family history includes Colitis in his sister; Colon cancer in his paternal grandmother.  REVIEW OF SYSTEMS  : All other systems reviewed and negative except where noted in the History of Present Illness.  PHYSICAL EXAM: BP 120/80   Pulse 90   Ht 5' 7 (1.702 m)   Wt 183 lb 6.4 oz (83.2 kg)   BMI 28.72 kg/m  Physical Exam   GENERAL APPEARANCE: Well nourished, in no apparent distress HEENT: No cervical lymphadenopathy, unremarkable thyroid, sclerae anicteric, conjunctiva pink RESPIRATORY: Respiratory effort normal, BS equal bilateral without rales, rhonchi, wheezing CARDIO: RRR with no MRGs, peripheral pulses intact ABDOMEN: Soft, non distended, active bowel sounds in all 4 quadrants, no tenderness to palpation, no rebound, no mass appreciated RECTAL: declines MUSCULOSKELETAL: Full ROM, normal gait, without edema SKIN: Dry, intact without rashes or lesions. No jaundice. NEURO: Alert, oriented, no focal deficits PSYCH: Cooperative, normal mood and affect.  Edmonia Gottron, PA-C 2:39 PM

## 2023-10-16 ENCOUNTER — Ambulatory Visit: Payer: Self-pay | Admitting: Physician Assistant

## 2023-10-17 ENCOUNTER — Other Ambulatory Visit: Payer: Self-pay

## 2023-10-17 DIAGNOSIS — R197 Diarrhea, unspecified: Secondary | ICD-10-CM

## 2023-10-17 DIAGNOSIS — K625 Hemorrhage of anus and rectum: Secondary | ICD-10-CM

## 2023-10-18 LAB — HELICOBACTER PYLORI  SPECIAL ANTIGEN
MICRO NUMBER:: 16596160
RESULT:: DETECTED — AB
SPECIMEN QUALITY: ADEQUATE

## 2023-10-18 LAB — TSH: TSH: 0.97 m[IU]/L (ref 0.40–4.50)

## 2023-10-18 LAB — C. DIFFICILE GDH AND TOXIN A/B
GDH ANTIGEN: NOT DETECTED
MICRO NUMBER:: 16596161
SPECIMEN QUALITY:: ADEQUATE
TOXIN A AND B: NOT DETECTED

## 2023-10-18 LAB — IGA: Immunoglobulin A: 433 mg/dL — ABNORMAL HIGH (ref 47–310)

## 2023-10-18 LAB — TISSUE TRANSGLUTAMINASE, IGA: (tTG) Ab, IgA: <1 U/mL

## 2023-10-18 NOTE — Addendum Note (Signed)
 Addended by: Carlyne Keehan H on: 10/18/2023 01:20 PM   Modules accepted: Orders

## 2023-10-19 ENCOUNTER — Other Ambulatory Visit: Payer: Self-pay

## 2023-10-19 LAB — OVA AND PARASITE EXAMINATION
CONCENTRATE RESULT:: NONE SEEN
MICRO NUMBER:: 16595876
SPECIMEN QUALITY:: ADEQUATE
TRICHROME RESULT:: NONE SEEN

## 2023-10-19 MED ORDER — HYDROCORTISONE (PERIANAL) 2.5 % EX CREA: 1.0000 | TOPICAL_CREAM | Freq: Two times a day (BID) | CUTANEOUS | 1 refills | Status: DC

## 2023-10-22 LAB — STOOL CULTURE
E coli, Shiga toxin Assay: NEGATIVE
Stool Culture result 1 (CMPCXR): NEGATIVE

## 2023-10-26 ENCOUNTER — Encounter (HOSPITAL_BASED_OUTPATIENT_CLINIC_OR_DEPARTMENT_OTHER): Payer: Self-pay | Admitting: Emergency Medicine

## 2023-10-26 ENCOUNTER — Telehealth: Payer: Self-pay | Admitting: Physician Assistant

## 2023-10-26 ENCOUNTER — Ambulatory Visit (HOSPITAL_BASED_OUTPATIENT_CLINIC_OR_DEPARTMENT_OTHER)
Admission: EM | Admit: 2023-10-26 | Discharge: 2023-10-26 | Disposition: A | Discharge status: Home / Self Care | Payer: Self-pay | Attending: Family Medicine | Admitting: Family Medicine

## 2023-10-26 ENCOUNTER — Ambulatory Visit (INDEPENDENT_AMBULATORY_CARE_PROVIDER_SITE_OTHER)
Admit: 2023-10-26 | Discharge: 2023-10-26 | Disposition: A | Discharge status: Home / Self Care | Payer: Self-pay | Attending: Physician Assistant | Admitting: Physician Assistant

## 2023-10-26 DIAGNOSIS — J069 Acute upper respiratory infection, unspecified: Secondary | ICD-10-CM

## 2023-10-26 DIAGNOSIS — H66002 Acute suppurative otitis media without spontaneous rupture of ear drum, left ear: Secondary | ICD-10-CM

## 2023-10-26 DIAGNOSIS — J3489 Other specified disorders of nose and nasal sinuses: Secondary | ICD-10-CM

## 2023-10-26 DIAGNOSIS — R1011 Right upper quadrant pain: Secondary | ICD-10-CM

## 2023-10-26 DIAGNOSIS — A048 Other specified bacterial intestinal infections: Secondary | ICD-10-CM

## 2023-10-26 MED ORDER — FLUTICASONE PROPIONATE 50 MCG/ACT NA SUSP: 1.0000 | Freq: Two times a day (BID) | NASAL | 0 refills | Status: AC | PRN

## 2023-10-26 MED ORDER — OMEPRAZOLE 40 MG PO CPDR: 40.0000 mg | DELAYED_RELEASE_CAPSULE | Freq: Two times a day (BID) | ORAL | 0 refills | Status: DC

## 2023-10-26 MED ORDER — BISMUTH SUBSALICYLATE 262 MG PO CHEW: 524.0000 mg | CHEWABLE_TABLET | Freq: Four times a day (QID) | ORAL | 0 refills | Status: AC

## 2023-10-26 MED ORDER — METRONIDAZOLE 250 MG PO TABS: 250.0000 mg | ORAL_TABLET | Freq: Four times a day (QID) | ORAL | 0 refills | Status: AC

## 2023-10-26 MED ORDER — ONDANSETRON HCL 4 MG PO TABS: 4.0000 mg | ORAL_TABLET | Freq: Three times a day (TID) | ORAL | 1 refills | Status: AC | PRN

## 2023-10-26 MED ORDER — DOXYCYCLINE HYCLATE 100 MG PO TABS: 100.0000 mg | ORAL_TABLET | Freq: Two times a day (BID) | ORAL | 0 refills | Status: AC

## 2023-10-26 NOTE — ED Provider Notes (Signed)
 [facility]    CSN: [id number] Arrival date & time: 10/26/23  1119      History   Chief Complaint No chief complaint on file.   HPI Robert Mitchell is a 58 y.o. male.   Patient reports he has chronic sinus infections.  On Monday, 10/22/2023, he was doing yard work that was very dusty.  By Tuesday, 10/23/2023, he had head congestion, fever, dizziness, fatigue and facial pressure or sinus pain.  He is coming to get a sinus infection treated.     Past Medical History:  Diagnosis Date   Arthritis    Pneumonia    UTI (urinary tract infection)     There are no active problems to display for this patient.   History reviewed. No pertinent surgical history.     Home Medications    Prior to Admission medications   Medication Sig Start Date End Date Taking? Authorizing Provider  bismuth subsalicylate (PEPTO-BISMOL) 262 MG chewable tablet Chew 2 tablets (524 mg total) by mouth 4 (four) times daily for 14 days. 10/26/23 11/09/23 Yes Craig Alan SAUNDERS, PA-C  doxycycline (VIBRA-TABS) 100 MG tablet Take 1 tablet (100 mg total) by mouth 2 (two) times daily for 14 days. 10/26/23 11/09/23 Yes Craig Alan R, PA-C  fluticasone (FLONASE) 50 MCG/ACT nasal spray Place 1 spray into both nostrils 2 (two) times daily as needed for rhinitis. 10/26/23 11/25/23 Yes Ival Domino, FNP  hydrocortisone  (ANUSOL -HC) 2.5 % rectal cream Place 1 Application rectally 2 (two) times daily. 10/19/23   Craig Alan SAUNDERS, PA-C  metroNIDAZOLE (FLAGYL) 250 MG tablet Take 1 tablet (250 mg total) by mouth 4 (four) times daily for 14 days. 10/26/23 11/09/23 Yes Craig Alan SAUNDERS, PA-C  omeprazole (PRILOSEC) 40 MG capsule Take 1 capsule (40 mg total) by mouth 2 (two) times daily for 14 days. 10/26/23 11/09/23 Yes Craig Alan R, PA-C  testosterone cypionate (DEPOTESTOSTERONE CYPIONATE) 200 MG/ML injection Inject 200 mg into the muscle once a week.   Yes [provider]  loperamide (IMODIUM A-D) 2 MG tablet  Take 2 mg by mouth 4 (four) times daily as needed for diarrhea or loose stools.    [provider]  meloxicam  (MOBIC ) 15 MG tablet TAKE 1 TABLET BY MOUTH EVERY DAY 08/16/20   Hilts, Ozell, MD  ondansetron (ZOFRAN) 4 MG tablet Take 1 tablet (4 mg total) by mouth every 8 (eight) hours as needed for nausea or vomiting. 10/26/23   Craig Alan SAUNDERS, PA-C    Family History Family History  Problem Relation Age of Onset   Colitis Sister    Colon cancer Paternal Grandmother     Social History Social History   Tobacco Use   Smoking status: Never   Smokeless tobacco: Current    Types: Chew     Allergies   Patient has no known allergies.   Review of Systems Review of Systems  Constitutional:  Positive for fatigue and fever. Negative for chills.  HENT:  Positive for congestion, postnasal drip, rhinorrhea, sinus pressure and sinus pain. Negative for ear pain and sore throat.   Eyes:  Negative for pain and visual disturbance.  Respiratory:  Negative for cough.   Cardiovascular:  Negative for chest pain and palpitations.  Gastrointestinal:  Negative for abdominal pain, constipation, diarrhea, nausea and vomiting.  Genitourinary:  Negative for dysuria and hematuria.  Musculoskeletal:  Negative for arthralgias and back pain.  Skin:  Negative for color change and rash.  Neurological:  Positive for headaches. Negative for  seizures and syncope.  All other systems reviewed and are negative.    Physical Exam Triage Vital Signs ED Triage Vitals  Encounter Vitals Group     BP 10/26/23 1144 135/80     Girls Systolic BP Percentile --      Girls Diastolic BP Percentile --      Boys Systolic BP Percentile --      Boys Diastolic BP Percentile --      Pulse Rate 10/26/23 1144 82     Resp 10/26/23 1144 18     Temp 10/26/23 1144 99 F (37.2 C)     Temp Source 10/26/23 1144 Oral     SpO2 10/26/23 1144 97 %     Weight --      Height --      Head Circumference --      Peak Flow --       Pain Score 10/26/23 1139 0     Pain Loc --      Pain Education --      Exclude from Growth Chart --    No data found.  Updated Vital Signs BP 135/80 (BP Location: Right Arm)   Pulse 82   Temp 99 F (37.2 C) (Oral)   Resp 18   SpO2 97%   Visual Acuity Right Eye Distance:   Left Eye Distance:   Bilateral Distance:    Right Eye Near:   Left Eye Near:    Bilateral Near:     Physical Exam Vitals and nursing note reviewed.  Constitutional:      General: He is not in acute distress.    Appearance: He is well-developed. He is not ill-appearing or toxic-appearing.  HENT:     Head: Normocephalic and atraumatic.     Right Ear: Hearing, tympanic membrane, ear canal and external ear normal.     Left Ear: Hearing, ear canal and external ear normal. A middle ear effusion is present. Tympanic membrane is bulging. Tympanic membrane is not erythematous.     Nose: Mucosal edema, congestion and rhinorrhea present. Rhinorrhea is clear.     Right Sinus: No maxillary sinus tenderness or frontal sinus tenderness.     Left Sinus: No maxillary sinus tenderness or frontal sinus tenderness.     Mouth/Throat:     Lips: Pink.     Mouth: Mucous membranes are moist.     Pharynx: Uvula midline. No oropharyngeal exudate or posterior oropharyngeal erythema.     Tonsils: No tonsillar exudate.   Eyes:     Conjunctiva/sclera: Conjunctivae normal.     Pupils: Pupils are equal, round, and reactive to light.    Cardiovascular:     Rate and Rhythm: Normal rate and regular rhythm.     Heart sounds: S1 normal and S2 normal. No murmur heard. Pulmonary:     Effort: Pulmonary effort is normal. No respiratory distress.     Breath sounds: Normal breath sounds. No decreased breath sounds, wheezing, rhonchi or rales.  Abdominal:     General: Bowel sounds are normal.     Palpations: Abdomen is soft.     Tenderness: There is no abdominal tenderness.   Musculoskeletal:        General: No swelling.      Cervical back: Neck supple.  Lymphadenopathy:     Head:     Right side of head: Submental and submandibular adenopathy present. No tonsillar, preauricular or posterior auricular adenopathy.     Left side of head: Submental and submandibular  adenopathy present. No tonsillar, preauricular or posterior auricular adenopathy.     Cervical: Cervical adenopathy present.     Right cervical: Superficial cervical adenopathy present.     Left cervical: Superficial cervical adenopathy present.   Skin:    General: Skin is warm and dry.     Capillary Refill: Capillary refill takes less than 2 seconds.     Findings: No rash.   Neurological:     Mental Status: He is alert and oriented to person, place, and time.   Psychiatric:        Mood and Affect: Mood normal.      UC Treatments / Results  Labs (all labs ordered are listed, but only abnormal results are displayed) Labs Reviewed - No data to display  EKG   Radiology No results found.  Procedures Procedures (including critical care time)  Medications Ordered in UC Medications - No data to display  Initial Impression / Assessment and Plan / UC Course  I have reviewed the triage vital signs and the nursing notes.  Pertinent labs & imaging results that were available during my care of the patient were reviewed by me and considered in my medical decision making (see chart for details).  Plan of Care: Otitis media and viral upper respiratory infection with sinus pressure: See discharge instructions.  Patient will be on doxycycline 100 mg twice daily for 14 days for H. pylori treatment.  Added fluticasone nasal spray, 1 spray into each nostril once or twice daily for nasal congestion and to open ear tubes.  Get plenty of fluids and rest.  Follow-up if symptoms do not improve, worsen or new symptoms occur.  I reviewed the plan of care with the patient and/or the patient's guardian.  The patient and/or guardian had time to ask questions and  acknowledged that the questions were answered.  I provided instruction on symptoms or reasons to return here or to go to an ER, if symptoms/condition did not improve, worsened or if new symptoms occurred.  Final Clinical Impressions(s) / UC Diagnoses   Final diagnoses:  Non-recurrent acute suppurative otitis media of left ear without spontaneous rupture of tympanic membrane  Viral URI with cough  Sinus pressure     Discharge Instructions      Patient has viral upper respiratory infection with cough, sinus pressure and he has an acute onset left otitis media or ear infection.  He also has been diagnosed with H. pylori this week by GI.  He is to pick up doxycycline 100 mg twice daily for 14 days as part of the treatment for the H. pylori.  That doxycycline will also treat both the ear infection and the sinus infection.  Due to treatment for the H. pylori infection, steroids are not a great choice for now.  Encouraged fluticasone nasal spray, 1 spray into each nostril once or twice daily to help open the nasal passages and the ear tubes.  Follow-up if symptoms do not improve, worsen or new symptoms occur.     ED Prescriptions     Medication Sig Dispense Auth. Provider   fluticasone (FLONASE) 50 MCG/ACT nasal spray Place 1 spray into both nostrils 2 (two) times daily as needed for rhinitis. 17 mL Ival Domino, FNP      PDMP not reviewed this encounter.   Ival Domino, FNP 10/26/23 1313

## 2023-10-26 NOTE — Telephone Encounter (Signed)
 See TE 10/26/23

## 2023-10-26 NOTE — ED Triage Notes (Signed)
 Pt c/o sinus congestion, fever, dizziness, and fatigue started on Tuesday

## 2023-10-26 NOTE — Telephone Encounter (Signed)
 Called and spoke to the patient.   Positive H. pylori gastritis based off stool which I did not see or result previoiusly, will give Quad therapy with medication listed below for 14 days   -bismuth subsalicylate 262 mg 2 tabs 4 times daily (#112)  -metronidazole 500mg  BID (#28) -doxycyline 100mg  BID (#28) -pantoprazole 40 mg twice daily.   I recommend that the patient abstain from all alcohol while taking metronidazole.   To avoid side effects of metronidazole, I recommend that he stick to simple meals and not eat rich or spicy food.  He should always try to take your metronidazole after a meal or snack. Most common side effects include nausea, vomiting, diarrhea, stomach upset and rash.   Eight weeks after completing the treatment, an H pylori stool antigen should be performed to monitor for response to treatment.   Need to start Florastor probiotics twice a day.   Return to the office after completing treatment.  States he has had fever this past week, last night was 102.7 Has decreased appetite, epigastric and RUQ pain.   States has constant cough, clearing throat cough. He has headache, burning in back of throat and thinks possible sinus infection.  No hematochezia, has had dark stools since on the iron. Has had some dizziness but no SOB or chest pain. Follow up with UC that he is going to right now to assure there is nothing more acute going on like perforated ulcer, gallbladder, etc.   He also states he had a HIDA a long time ago with low ejection fraction. Will get RUQ US  and consider HIDA/referral.

## 2023-10-26 NOTE — Discharge Instructions (Signed)
 Patient has viral upper respiratory infection with cough, sinus pressure and he has an acute onset left otitis media or ear infection.  He also has been diagnosed with H. pylori this week by GI.  He is to pick up doxycycline 100 mg twice daily for 14 days as part of the treatment for the H. pylori.  That doxycycline will also treat both the ear infection and the sinus infection.  Due to treatment for the H. pylori infection, steroids are not a great choice for now.  Encouraged fluticasone nasal spray, 1 spray into each nostril once or twice daily to help open the nasal passages and the ear tubes.  Follow-up if symptoms do not improve, worsen or new symptoms occur.

## 2023-10-29 ENCOUNTER — Ambulatory Visit: Payer: Self-pay | Admitting: Physician Assistant

## 2023-10-29 DIAGNOSIS — R935 Abnormal findings on diagnostic imaging of other abdominal regions, including retroperitoneum: Secondary | ICD-10-CM

## 2023-10-29 DIAGNOSIS — R160 Hepatomegaly, not elsewhere classified: Secondary | ICD-10-CM

## 2023-10-29 DIAGNOSIS — K769 Liver disease, unspecified: Secondary | ICD-10-CM

## 2023-10-29 DIAGNOSIS — R1011 Right upper quadrant pain: Secondary | ICD-10-CM

## 2023-10-29 NOTE — Telephone Encounter (Signed)
 Secure staff message sent to radiology scheduling to contact patient to set up appt. MyChart message sent to patient with information.

## 2023-11-23 ENCOUNTER — Encounter: Payer: Self-pay | Admitting: Gastroenterology

## 2023-11-27 ENCOUNTER — Other Ambulatory Visit (INDEPENDENT_AMBULATORY_CARE_PROVIDER_SITE_OTHER): Payer: Self-pay

## 2023-11-27 ENCOUNTER — Ambulatory Visit: Payer: Self-pay | Admitting: Gastroenterology

## 2023-11-27 ENCOUNTER — Encounter: Payer: Self-pay | Admitting: Gastroenterology

## 2023-11-27 VITALS — BP 114/75 | HR 64 | Temp 99.6°F | Resp 26 | Ht 67.0 in | Wt 183.0 lb

## 2023-11-27 DIAGNOSIS — K6289 Other specified diseases of anus and rectum: Secondary | ICD-10-CM

## 2023-11-27 DIAGNOSIS — A048 Other specified bacterial intestinal infections: Secondary | ICD-10-CM

## 2023-11-27 DIAGNOSIS — K449 Diaphragmatic hernia without obstruction or gangrene: Secondary | ICD-10-CM

## 2023-11-27 DIAGNOSIS — K2950 Unspecified chronic gastritis without bleeding: Secondary | ICD-10-CM

## 2023-11-27 DIAGNOSIS — K2960 Other gastritis without bleeding: Secondary | ICD-10-CM

## 2023-11-27 DIAGNOSIS — C20 Malignant neoplasm of rectum: Secondary | ICD-10-CM

## 2023-11-27 DIAGNOSIS — K5730 Diverticulosis of large intestine without perforation or abscess without bleeding: Secondary | ICD-10-CM

## 2023-11-27 DIAGNOSIS — R1011 Right upper quadrant pain: Secondary | ICD-10-CM

## 2023-11-27 DIAGNOSIS — D122 Benign neoplasm of ascending colon: Secondary | ICD-10-CM

## 2023-11-27 LAB — COMPREHENSIVE METABOLIC PANEL WITH GFR
ALT: 10 U/L (ref 0–53)
AST: 9 U/L (ref 0–37)
Albumin: 3.9 g/dL (ref 3.5–5.2)
Alkaline Phosphatase: 42 U/L (ref 39–117)
BUN: 9 mg/dL (ref 6–23)
CO2: 27 meq/L (ref 19–32)
Calcium: 8.6 mg/dL (ref 8.4–10.5)
Chloride: 103 meq/L (ref 96–112)
Creatinine, Ser: 1.12 mg/dL (ref 0.40–1.50)
GFR: 73.33 mL/min
Glucose, Bld: 74 mg/dL (ref 70–99)
Potassium: 4.1 meq/L (ref 3.5–5.1)
Sodium: 139 meq/L (ref 135–145)
Total Bilirubin: 0.7 mg/dL (ref 0.2–1.2)
Total Protein: 6.6 g/dL (ref 6.0–8.3)

## 2023-11-27 LAB — CBC WITH DIFFERENTIAL/PLATELET
Basophils Absolute: 0 K/uL (ref 0.0–0.1)
Basophils Relative: 0.4 % (ref 0.0–3.0)
Eosinophils Absolute: 0.2 K/uL (ref 0.0–0.7)
Eosinophils Relative: 3.6 % (ref 0.0–5.0)
HCT: 45.4 % (ref 39.0–52.0)
Hemoglobin: 14.7 g/dL (ref 13.0–17.0)
Lymphocytes Relative: 31.5 % (ref 12.0–46.0)
Lymphs Abs: 1.8 K/uL (ref 0.7–4.0)
MCHC: 32.4 g/dL (ref 30.0–36.0)
MCV: 86.4 fl (ref 78.0–100.0)
Monocytes Absolute: 0.3 K/uL (ref 0.1–1.0)
Monocytes Relative: 5.6 % (ref 3.0–12.0)
Neutro Abs: 3.3 K/uL (ref 1.4–7.7)
Neutrophils Relative %: 58.9 % (ref 43.0–77.0)
Platelets: 245 K/uL (ref 150.0–400.0)
RBC: 5.26 Mil/uL (ref 4.22–5.81)
RDW: 17.7 % — ABNORMAL HIGH (ref 11.5–15.5)
WBC: 5.6 K/uL (ref 4.0–10.5)

## 2023-11-27 MED ORDER — SODIUM CHLORIDE 0.9 % IV SOLN: 500.0000 mL | Freq: Once | INTRAVENOUS | Status: AC

## 2023-11-27 MED ORDER — PANTOPRAZOLE SODIUM 40 MG PO TBEC: 40.0000 mg | DELAYED_RELEASE_TABLET | Freq: Every day | ORAL | 0 refills | Status: DC

## 2023-11-27 NOTE — Patient Instructions (Signed)
 YOU HAD AN ENDOSCOPIC PROCEDURE TODAY AT THE [facility]:   Refer to the procedure report that was given to you for any specific questions about what was found during the examination.  If the procedure report does not answer your questions, please call your gastroenterologist to clarify.  If you requested that your care partner not be given the details of your procedure findings, then the procedure report has been included in a sealed envelope for you to review at your convenience later.  YOU SHOULD EXPECT: Some feelings of bloating in the abdomen. Passage of more gas than usual.  Walking can help get rid of the air that was put into your GI tract during the procedure and reduce the bloating. If you had a lower endoscopy (such as a colonoscopy or flexible sigmoidoscopy) you may notice spotting of blood in your stool or on the toilet paper. If you underwent a bowel prep for your procedure, you may not have a normal bowel movement for a few days.  Please Note:  You might notice some irritation and congestion in your nose or some drainage.  This is from the oxygen used during your procedure.  There is no need for concern and it should clear up in a day or so.  SYMPTOMS TO REPORT IMMEDIATELY:  Following lower endoscopy (colonoscopy or flexible sigmoidoscopy):  Excessive amounts of blood in the stool  Significant tenderness or worsening of abdominal pains  Swelling of the abdomen that is new, acute  Fever of 100F or higher  Following upper endoscopy (EGD)  Vomiting of blood or coffee ground material  New chest pain or pain under the shoulder blades  Painful or persistently difficult swallowing  New shortness of breath  Fever of 100F or higher  Black, tarry-looking stools  Colonoscopy Resume previous diet Continue present medications Await pathology results CT of abdomen/pelvis/chest with contrast  Endoscopy Resume previous diet Reflux handout given Continue present  medications including Protonix  - 40 mg by mouth daily Await pathology results    For urgent or emergent issues, a gastroenterologist can be reached at any hour by calling [phone]. Do not use MyChart messaging for urgent concerns.    DIET:  We do recommend a small meal at first, but then you may proceed to your regular diet.  Drink plenty of fluids but you should avoid alcoholic beverages for 24 hours.  ACTIVITY:  You should plan to take it easy for the rest of today and you should NOT DRIVE or use heavy machinery until tomorrow (because of the sedation medicines used during the test).    FOLLOW UP: Our staff will call the number listed on your records the next business day following your procedure.  We will call around 7:15- 8:00 am to check on you and address any questions or concerns that you may have regarding the information given to you following your procedure. If we do not reach you, we will leave a message.     If any biopsies were taken you will be contacted by phone or by letter within the next 1-3 weeks.  Please call us  at [phone] if you have not heard about the biopsies in 3 weeks.    SIGNATURES/CONFIDENTIALITY: You and/or your care partner have signed paperwork which will be entered into your electronic medical record.  These signatures attest to the fact that that the information above on your After Visit Summary has been reviewed and is understood.  Full responsibility of the confidentiality of  this discharge information lies with you and/or your care-partner.

## 2023-11-27 NOTE — Progress Notes (Signed)
 10/15/2023 Robert Mitchell [id number] [birth year]   Referring provider: Silver Lamar LABOR, MD Primary GI doctor: Dr. Charlanne   ASSESSMENT AND PLAN:  2 months for fecal incontinence with urination/relaxation/gas, twice a day but the up to 7 x a day, started on imodium and this helped Lost 15 lbs weight loss during that time He last took imodium yesterday, had normal BM today No AB pain PGM colon cancer age 58, father with colon polyps sister with collagenous colitis Never had a colonoscopy Chronic rectal bleeding with fresh red blood, intermittent melena, and diarrhea since teenage years, recently exacerbated. Differential includes hemorrhoids, collagenous colitis, and potential colon cancer. Melena suggests possible upper GI bleeding. Family history of colon cancer and polyps increases colorectal pathology risk. Colonoscopy necessary for evaluation. Cologuard not indicated due to high false positives. - Order colonoscopy at [facility], We have discussed the risks of bleeding, infection, perforation, medication reactions, and remote risk of death associated with colonoscopy. All questions were answered and the patient acknowledges these risk and wishes to proceed.. - Order stool studies for H. Pylori since patient declines EGD - order stool C. difficile. - Order blood tests including CBC, iron studies, and celiac panel. - Prescribe Calmol 4 suppositories and hemorrhoid cream. - Advise continuation of loperamide as needed, max 16 mg/day.   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 Topical treatments discussed. - Prescribe Calmol 4 suppositories and hemorrhoid cream.   Black stools x 2-3 with history of GERD but resolved today, history of GERD severe but last 2 months this resolved, no dysphagia, no nausea, vomiting Once every 3-4 months since he was a teenager No pepto/iron No NSAIDS, no ETOH, no drug use - declines EGD -  check for anemia, consider EGD if shows anemia - check H pylori and celiac - add on pantoprazole  40 mg once daily   Patient Care Team: Silver Lamar LABOR, MD as PCP - General (Family Medicine)   HISTORY OF PRESENT ILLNESS: 58 y.o. self-pay male with a past medical history listed below presents for evaluation of abdominal pain.    No labs or imaging to review.   Discussed the use of AI scribe software for clinical note transcription with the patient, who gave verbal consent to proceed.   History of Present Illness   Robert Mitchell is a 58 year old male who presents with changes in bowel habits, rectal bleeding, and weight loss.   Over the past two months, he has experienced significant changes in bowel habits, including diarrhea and rectal bleeding. The diarrhea progressed from once daily to seven to eight times a day, resulting in a weight loss of 15 to 20 pounds. He uses Imodium to manage the diarrhea, which is effective but causes nausea and lethargy. His bowel movements have always been loose, described as 'kind of like peanut butter', and never solid since childhood.   He reports intermittent episodes of melena since his teenage years, occurring every three to four months. Recently, he had melena for three days, which resolved spontaneously. He denies the use of Pepto Bismol, iron supplements, or any medications that could cause melena. He has a history of chronic heartburn and indigestion, which resolved two months ago without medication.   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 He also reports a slimy, clear liquid leakage from the rectum for the past few  years, requiring the use of paper towels for management. No abdominal pain, bloating, or gas.   He has experienced fecal incontinence over the past two months, particularly when relaxing to urinate, which began suddenly while standing in a store. This has  improved recently, and he reports a normal bowel movement today.   No trouble swallowing, nausea, or vomiting. He reports night sweats for the past six months and occasional dizziness when standing up too quickly.   His current medications include testosterone, which he has been taking on and off for the last 30 years, currently at a dose of half a cc weekly for the past three years. He denies the use of Aleve, ibuprofen, Goody powders, alcohol, or recreational drugs.   There is a family history of colon cancer in his paternal grandmother, diagnosed at age 30. His father has had non-cancerous polyps removed, and his sister has collagenous colitis and diverticulosis.   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           He  reports that he has never smoked. His smokeless tobacco use includes chew. No history on file for alcohol use and drug use.   RELEVANT GI HISTORY, IMAGING AND LABS: Results            CBC Labs (Brief)  No results found for: WBC, RBC, HGB, HCT, PLT, MCV, MCH, MCHC, RDW, LYMPHSABS, MONOABS, EOSABS, BASOSABS   Recent Labs (within last 365 days)  No results for input(s): HGB in the last 8760 hours.     CMP     Labs (Brief)  No results found for: NA, K, CL, CO2, GLUCOSE, BUN, CREATININE, CALCIUM, PROT, ALBUMIN, AST, ALT, ALKPHOS, BILITOT, GFRNONAA, GFRAA         No data to display             Current Medications:    Current Outpatient Medications (Endocrine & Metabolic):    testosterone cypionate (DEPOTESTOSTERONE CYPIONATE) 200 MG/ML injection, Inject 200 mg into the muscle once a week.       Current Outpatient Medications (Analgesics):    meloxicam  (MOBIC ) 15 MG tablet, TAKE 1 TABLET BY MOUTH EVERY DAY     Current Outpatient Medications (Other):    loperamide (IMODIUM A-D) 2 MG tablet, Take 2 mg by mouth 4  (four) times daily as needed for diarrhea or loose stools.   Na Sulfate-K Sulfate-Mg Sulfate concentrate (SUPREP BOWEL PREP KIT) 17.5-3.13-1.6 GM/177ML SOLN, Take 1 kit (354 mLs total) by mouth once for 1 dose.     Medical History:      Past Medical History:  Diagnosis Date   Arthritis     Pneumonia     UTI (urinary tract infection)          Allergies:  Allergies  No Known Allergies      Surgical History:  He  has no past surgical history on file. Family History:  His family history includes Colitis in his sister; Colon cancer in his paternal grandmother.   REVIEW OF SYSTEMS  : All other systems reviewed and negative except where noted in the History of Present Illness.   PHYSICAL EXAM: BP 120/80   Pulse 90   Ht 5' 7 (1.702 m)   Wt 183 lb 6.4 oz (83.2 kg)   BMI 28.72 kg/m  Physical Exam   GENERAL APPEARANCE: Well nourished, in no apparent distress HEENT: No cervical lymphadenopathy,  unremarkable thyroid, sclerae anicteric, conjunctiva pink RESPIRATORY: Respiratory effort normal, BS equal bilateral without rales, rhonchi, wheezing CARDIO: RRR with no MRGs, peripheral pulses intact ABDOMEN: Soft, non distended, active bowel sounds in all 4 quadrants, no tenderness to palpation, no rebound, no mass appreciated RECTAL: declines MUSCULOSKELETAL: Full ROM, normal gait, without edema SKIN: Dry, intact without rashes or lesions. No jaundice. NEURO: Alert, oriented, no focal deficits PSYCH: Cooperative, normal mood and affect.       Alan JONELLE Coombs, PA-C   Attending physician's note   I have taken history, reviewed the chart and examined the patient. I performed a substantive portion of this encounter, including complete performance of at least one of the key components, in conjunction with the APP. I agree with the Advanced Practitioner's note, impression and recommendations.   Willing to proceed with EGD and colon today HP + stool Ag, treated with  Metronidazole /doxycycline /Protonix /bismuth  09/2023   Anselm Bring, MD [facility] [phone]

## 2023-11-27 NOTE — Op Note (Signed)
 [facility] Patient Name: Robert Mitchell Procedure Date: 11/27/2023 2:09 PM MRN: [id number] Endoscopist: Lynnie Bring , MD, [id number] Age: 58 Referring MD:  Date of Birth: [birth year] Gender: Male Account #: [id number] Procedure:                Upper GI endoscopy Indications:              Epigastric abdominal pain. + HP stool antigen                            09/2023 (treated with quad therapy). IDA Medicines:                Monitored Anesthesia Care Procedure:                Pre-Anesthesia Assessment:                           - Prior to the procedure, a History and Physical                            was performed, and patient medications and                            allergies were reviewed. The patient's tolerance of                            previous anesthesia was also reviewed. The risks                            and benefits of the procedure and the sedation                            options and risks were discussed with the patient.                            All questions were answered, and informed consent                            was obtained. Prior Anticoagulants: The patient has                            taken no anticoagulant or antiplatelet agents. ASA                            Grade Assessment: II - A patient with mild systemic                            disease. After reviewing the risks and benefits,                            the patient was deemed in satisfactory condition to                            undergo the procedure.  After obtaining informed consent, the endoscope was                            passed under direct vision. Throughout the                            procedure, the patient's blood pressure, pulse, and                            oxygen saturations were monitored continuously. The                            GIF [id number] #[id number] was introduced through the                            mouth, and advanced to  the second part of duodenum.                            The upper GI endoscopy was accomplished without                            difficulty. The patient tolerated the procedure                            well. Scope In: Scope Out: Findings:                 The examined esophagus was normal.                           The Z-line was regular and was found 35 cm from the                            incisors. Healed distal esophageal erosions.                           A small hiatal hernia was present.                           Minimal inflammation characterized by erythema was                            found in the entire examined stomach. Biopsies were                            taken with a cold forceps for histology.                           The examined duodenum was normal. Biopsies for                            histology were taken with a cold forceps for                            evaluation of celiac disease.  Complications:            No immediate complications. Estimated Blood Loss:     Estimated blood loss: none. Impression:               - Small hiatal hernia.                           - Mild gastritis. Biopsied. Recommendation:           - Patient has a contact number available for                            emergencies. The signs and symptoms of potential                            delayed complications were discussed with the                            patient. Return to normal activities tomorrow.                            Written discharge instructions were provided to the                            patient.                           - Resume previous diet.                           - Brochures regarding reflux.                           - Continue present medications including Protonix                             40 mg p.o. daily #90, 4RF.                           - Await pathology results.                           - The findings and recommendations were discussed                             with the patient's family. Lynnie Bring, MD 11/27/2023 2:24:20 PM This report has been signed electronically.

## 2023-11-27 NOTE — Op Note (Signed)
 [facility] Patient Name: Robert Mitchell Procedure Date: 11/27/2023 2:08 PM MRN: [id number] Endoscopist: Lynnie Bring , MD, [id number] Age: 58 Referring MD:  Date of Birth: 04-26-1966 Gender: Male Account #: [id number] Procedure:                Colonoscopy Indications:              H/O rectal bleeding with iron deficiency anemia Medicines:                Monitored Anesthesia Care Procedure:                Pre-Anesthesia Assessment:                           - Prior to the procedure, a History and Physical                            was performed, and patient medications and                            allergies were reviewed. The patient's tolerance of                            previous anesthesia was also reviewed. The risks                            and benefits of the procedure and the sedation                            options and risks were discussed with the patient.                            All questions were answered, and informed consent                            was obtained. Prior Anticoagulants: The patient has                            taken no anticoagulant or antiplatelet agents. ASA                            Grade Assessment: II - A patient with mild systemic                            disease. After reviewing the risks and benefits,                            the patient was deemed in satisfactory condition to                            undergo the procedure.                           After obtaining informed consent, the colonoscope  was passed under direct vision. Throughout the                            procedure, the patient's blood pressure, pulse, and                            oxygen saturations were monitored continuously. The                            CF [id number] #[id number] was introduced through the anus                            and advanced to the the cecum, identified by                            appendiceal  orifice and ileocecal valve. The                            colonoscopy was performed without difficulty. The                            patient tolerated the procedure well. The quality                            of the bowel preparation was adequate to identify                            polyps greater than 5 mm in size. The ileocecal                            valve, appendiceal orifice, and rectum were                            photographed. Scope In: 2:26:29 PM Scope Out: 2:48:24 PM Scope Withdrawal Time: 0 hours 16 minutes 41 seconds  Total Procedure Duration: 0 hours 21 minutes 55 seconds  Findings:                 A 5 cm circumferential, infiltrative, polypoid and                            ulcerated partially obstructing medium-sized mass                            was found in the mid rectum and in the distal                            rectum, extending from 3 cm (from the dentate line)                            upto 8 cm. The most distal margin was palpated on                            rectal exam.  Luminal diameter was 12mm, which did                            allow passage of adult colonoscope. This was                            biopsied with a cold forceps for histology. Most                            proximal margin was tattooed with an injection of 2                            mL of Spot (carbon black). Distal margin not                            tattooed since it could be felt on rectal exam.                           A 12 mm polyp was found in the mid ascending colon.                            The polyp was sessile. The polyp was removed with a                            cold snare. The polyp was removed with a piecemeal                            technique using a cold snare. Resection and                            retrieval were complete.                           A few medium-mouthed and small-mouthed diverticula                            were found in the  sigmoid colon.                           Retroflexion in the right colon was performed.                           The exam was otherwise without abnormality on                            direct and retroflexion views. Complications:            No immediate complications. Estimated Blood Loss:     Estimated blood loss was minimal. 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 Biopsied.                            Tattooed.                           - One 12 mm polyp in the mid ascending colon,                            removed with a cold snare and removed piecemeal                            using a cold snare. Resected and retrieved.                           - Mild sigmoid diverticulosis.                           - The examination was otherwise normal on direct                            and retroflexion views. Recommendation:           - Patient has a contact number available for                            emergencies. The signs and symptoms of potential                            delayed complications were discussed with the                            patient. Return to normal activities tomorrow.                            Written discharge instructions were provided to the                            patient.                           - Resume previous diet.                           - Continue present medications.                           - Await pathology results. Send [facility].                           - Check CBC, CMP, CEA level.                           - CT Abdo/pelvis/chest with contrast.                           - Thereafter would need oncology/surgical  consultation                           - The findings and recommendations were discussed                            with the patient's family. Lynnie Bring, MD 11/27/2023 3:01:35 PM This report has  been signed electronically.

## 2023-11-27 NOTE — Progress Notes (Signed)
To PACU, VSS. Report to [email or web address].tb 

## 2023-11-27 NOTE — Progress Notes (Signed)
 Pt's states no medical or surgical changes since previsit or office visit.

## 2023-11-28 ENCOUNTER — Ambulatory Visit: Payer: Self-pay | Admitting: Gastroenterology

## 2023-11-28 ENCOUNTER — Telehealth: Payer: Self-pay | Admitting: *Deleted

## 2023-11-28 ENCOUNTER — Telehealth: Payer: Self-pay

## 2023-11-28 LAB — SURGICAL PATHOLOGY

## 2023-11-28 LAB — CEA: CEA: 6.9 ng/mL — ABNORMAL HIGH

## 2023-11-28 NOTE — Progress Notes (Signed)
 Please inform the patient. Discussed biopsy results with patient over the phone in detail Bx-rectal mass consistent with adenocarcinoma  Plan: - Proceed with CT chest/Abdo/pelvis with contrast as scheduled 8/1 - Referral to oncology in [place] (Dr. Lewis/Dr. Cornelius) ASAP.  Patient would like to have care near home. - Patient is scheduled for MRI liver on 8/14.  Will keep that appointment.  Likely, may have to add MRI pelvis.  No need for letter.  Send report to family physician

## 2023-11-28 NOTE — Addendum Note (Signed)
 Addended by: KATHIE BOTTCHER E on: 11/28/2023 09:11 AM   Modules accepted: Orders

## 2023-11-28 NOTE — Telephone Encounter (Signed)
 Patient made aware and said he will look on mychart for the info but he was made aware

## 2023-11-28 NOTE — Telephone Encounter (Signed)
  Follow up Call-     11/27/2023    1:44 PM  Call back number  Post procedure Call Back phone  # [phone]  Permission to leave phone message Yes     Patient questions:  Do you have a fever, pain , or abdominal swelling? No. Pain Score  0 *  Have you tolerated food without any problems? Yes.    Have you been able to return to your normal activities? Yes.    Do you have any questions about your discharge instructions: Diet   No. Medications  No. Follow up visit  No.  Do you have questions or concerns about your Care? No.  Actions: * If pain score is 4 or above: No action needed, pain <4.

## 2023-11-29 NOTE — Telephone Encounter (Signed)
 Patient requesting f/u call to discuss recommendations for lifting? Please advise.

## 2023-11-30 ENCOUNTER — Telehealth: Payer: Self-pay | Admitting: Gastroenterology

## 2023-11-30 ENCOUNTER — Other Ambulatory Visit: Payer: Self-pay

## 2023-11-30 ENCOUNTER — Ambulatory Visit (INDEPENDENT_AMBULATORY_CARE_PROVIDER_SITE_OTHER)
Admission: RE | Admit: 2023-11-30 | Discharge: 2023-11-30 | Disposition: A | Discharge status: Home / Self Care | Payer: Self-pay | Source: Ambulatory Visit | Attending: Gastroenterology | Admitting: Gastroenterology

## 2023-11-30 DIAGNOSIS — K2960 Other gastritis without bleeding: Secondary | ICD-10-CM

## 2023-11-30 DIAGNOSIS — R1011 Right upper quadrant pain: Secondary | ICD-10-CM

## 2023-11-30 DIAGNOSIS — R160 Hepatomegaly, not elsewhere classified: Secondary | ICD-10-CM

## 2023-11-30 DIAGNOSIS — C801 Malignant (primary) neoplasm, unspecified: Secondary | ICD-10-CM

## 2023-11-30 DIAGNOSIS — K6289 Other specified diseases of anus and rectum: Secondary | ICD-10-CM

## 2023-11-30 DIAGNOSIS — K769 Liver disease, unspecified: Secondary | ICD-10-CM

## 2023-11-30 MED ORDER — IOHEXOL 300 MG/ML  SOLN
100.0000 mL | Freq: Once | INTRAMUSCULAR | Status: AC | PRN
  Administered 2023-11-30: 100 mL via INTRAVENOUS

## 2023-11-30 NOTE — Telephone Encounter (Signed)
 No problems in lifting weight We were talking about weights once he starts treatment. Needs appt with [facility]  They can ask oncology about testosterone. RG

## 2023-11-30 NOTE — Telephone Encounter (Signed)
 Pt stated that he recently spoke with Dr. Charlanne who notified him that he should not be lifting heavy. Pt stated that he was not clear if that is to start now or once he starts treatments. 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 Pt also questioned about his Testosterone injections  if he should continue them. Please review and advise

## 2023-11-30 NOTE — Telephone Encounter (Signed)
 Inbound call from patient stating that Dr. Charlanne advised him that he does not need to be lifting, patient is requesting a call back to discuss if he should start not lifting now, or when he starts treatment. Patient states he has an appointment for CT scan from 10- 2 and can leave a message if you are unable to get a hold of him.  Please advise.

## 2023-11-30 NOTE — Progress Notes (Signed)
 I called patient's cell phone-no answer Called wife cell phone and left a detailed message as she had told me that I could leave a detailed message  Plan: - Please keep appointment for MRI liver.  [facility], can you add MRI pelvis as well (RE: rectal CA) - Also oncology appointment at [facility] with Dr. Cornelius or Dr. Ezzard.  We will let them order PET/CT if needed  Send report to family physician

## 2023-12-03 ENCOUNTER — Telehealth: Payer: Self-pay | Admitting: Oncology

## 2023-12-03 ENCOUNTER — Telehealth: Payer: Self-pay

## 2023-12-03 NOTE — Telephone Encounter (Signed)
 12/03/23 Spoke with patient and scheduled New patient referral appt with Dr Valaria Kerns.

## 2023-12-03 NOTE — Telephone Encounter (Signed)
 Pt made aware of Dr. Charlanne recommendations.

## 2023-12-03 NOTE — Telephone Encounter (Signed)
 Pt stated that he wanted to notify staff that he had a BM last night and this morning with a small amount of Bright red blood, Pt does have a history of rectal bleeding, Recent Colonoscopy, recent CT scan, Pt scheduled to see Oncology and have an MRI upcoming. Pt was notified that if the bleeding increases or the frequency increases to please make us  aware.  Pt notified that is he has any SOB, Chest Pain, Dizziness, Lightheadedness to please go to the ED.  Pt verbalized understanding with all questions answered.

## 2023-12-12 ENCOUNTER — Telehealth: Payer: Self-pay | Admitting: Gastroenterology

## 2023-12-12 NOTE — Telephone Encounter (Signed)
 Inbound call from patient's wife stating patient received a call from imaging in [place] stating they are unable to do pelvic MRI. States patient may have to come to [facility] to be able to have MRI of abdomen and pelvis completed. Wife is requesting a call back Please advise, thank you

## 2023-12-12 NOTE — Telephone Encounter (Signed)
 Pt stated that [facility] radiology contacted him and notified him that they could not do the MRI of the Pelvis.  Pt was scheduled for MRI of the Abdomen on 12/13/2023 at 12:00 at [facility]. Pt to arrive at 11:30 AM. Pt to have nothing to eat or drink four hours prior.Pt wife Karna  made aware. Pt was scheduled for the MRI of the Pelvis on 12/14/2023 at 11:00 AM pt to arrive at 10:30 AM at [facility]. [place] made aware.  Karna verbalized understanding with all questions answered.

## 2023-12-13 ENCOUNTER — Telehealth: Payer: Self-pay | Admitting: Gastroenterology

## 2023-12-13 ENCOUNTER — Other Ambulatory Visit (HOSPITAL_BASED_OUTPATIENT_CLINIC_OR_DEPARTMENT_OTHER): Payer: Self-pay | Admitting: Radiology

## 2023-12-13 ENCOUNTER — Ambulatory Visit (HOSPITAL_COMMUNITY)
Admission: RE | Admit: 2023-12-13 | Discharge: 2023-12-13 | Disposition: A | Discharge status: Home / Self Care | Payer: Self-pay | Source: Ambulatory Visit | Attending: Physician Assistant | Admitting: Physician Assistant

## 2023-12-13 DIAGNOSIS — R160 Hepatomegaly, not elsewhere classified: Secondary | ICD-10-CM

## 2023-12-13 DIAGNOSIS — K769 Liver disease, unspecified: Secondary | ICD-10-CM | POA: Insufficient documentation

## 2023-12-13 DIAGNOSIS — K6289 Other specified diseases of anus and rectum: Secondary | ICD-10-CM | POA: Insufficient documentation

## 2023-12-13 DIAGNOSIS — R1011 Right upper quadrant pain: Secondary | ICD-10-CM | POA: Insufficient documentation

## 2023-12-13 DIAGNOSIS — C801 Malignant (primary) neoplasm, unspecified: Secondary | ICD-10-CM | POA: Insufficient documentation

## 2023-12-13 MED ORDER — GADOBUTROL 1 MMOL/ML IV SOLN
8.0000 mL | Freq: Once | INTRAVENOUS | Status: AC | PRN
  Administered 2023-12-13: 8 mL via INTRAVENOUS

## 2023-12-13 NOTE — Telephone Encounter (Signed)
 Patient wife called and stated that he is having symptoms again back when he was diagnosed with H pylori. Symptoms include dizziness, excessive gas, decrease in appetite and has lost weight. Patient is requesting that we return her or husband a call back in order to discuss more. Please advise.

## 2023-12-14 ENCOUNTER — Ambulatory Visit (HOSPITAL_COMMUNITY)
Admission: RE | Admit: 2023-12-14 | Discharge: 2023-12-14 | Disposition: A | Discharge status: Home / Self Care | Payer: Self-pay | Source: Ambulatory Visit | Attending: Gastroenterology | Admitting: Gastroenterology

## 2023-12-14 DIAGNOSIS — R160 Hepatomegaly, not elsewhere classified: Secondary | ICD-10-CM

## 2023-12-14 DIAGNOSIS — R1011 Right upper quadrant pain: Secondary | ICD-10-CM

## 2023-12-14 DIAGNOSIS — C801 Malignant (primary) neoplasm, unspecified: Secondary | ICD-10-CM

## 2023-12-14 DIAGNOSIS — K6289 Other specified diseases of anus and rectum: Secondary | ICD-10-CM

## 2023-12-14 DIAGNOSIS — K769 Liver disease, unspecified: Secondary | ICD-10-CM

## 2023-12-14 MED ORDER — GADOBUTROL 1 MMOL/ML IV SOLN
7.5000 mL | Freq: Once | INTRAVENOUS | Status: AC | PRN
  Administered 2023-12-14: 7.5 mL via INTRAVENOUS

## 2023-12-14 NOTE — Telephone Encounter (Signed)
 Pt stated that he is continuing to have GI symptoms, such as weight loss. Pt stated that he is averaging a weigh loss of 1 lb per day, excess gas, loss of appetite, dizziness at times, low grade fever for a week 99.8, no abdominal pain, very little nausea, having BM's that are loose but better than before, Taking imodium 1 tablet twice a day, averaging 4 BM a day, making him self to eat.  Pt questioned could he still be positive for the H Pylori. Chart was reviewed and noted the that he was negative for the H pylori per path report  ADDENDUM  Immunohistochemical stain for Helicobacter pylori is negative.  Rebbecca Come, [place], Sports administrator, International aid/development worker  ( Signed [phone])   Please review and advise.  Pt had MRI yesterday of abdomen and MRI of Pelvis today. Just FYI

## 2023-12-17 ENCOUNTER — Ambulatory Visit: Payer: Self-pay | Admitting: Physician Assistant

## 2023-12-17 MED ORDER — MIRTAZAPINE 15 MG PO TABS: 15.0000 mg | ORAL_TABLET | Freq: Every day | ORAL | 0 refills | Status: DC

## 2023-12-17 NOTE — Telephone Encounter (Signed)
 Called and spoke with the patient.  He has periods of diarrhea and then several days of formed stools and then loose stools.  Denies any significant abdominal pain, distention.   No signs of obstruction at this time. He has had decreased appetite and significant weight loss, trouble sleeping and is very anxious about his rectal cancer diagnosis. Encourage patient to keep the appointment follow-up at the 20th with oncology. Will do trial of Remeron  15 mg at night for anxiety, appetite, consider increasing dose if tolerated. Discussed making sure he keeps stools soft to prevent constipation sups obstruction. ER precautions discussed with patient.

## 2023-12-18 ENCOUNTER — Ambulatory Visit: Payer: Self-pay | Admitting: Gastroenterology

## 2023-12-18 ENCOUNTER — Other Ambulatory Visit: Payer: Self-pay | Admitting: Oncology

## 2023-12-18 DIAGNOSIS — C20 Malignant neoplasm of rectum: Secondary | ICD-10-CM

## 2023-12-18 NOTE — Progress Notes (Unsigned)
 [facility] at [facility] [address] [place],  [place]  [address] [phone]  Clinic Day:  12/19/2023  Referring physician: Silver Lamar LABOR, MD   HISTORY OF PRESENT ILLNESS:  The patient is a 58 y.o. male who I was asked to consult upon for newly diagnosed rectal cancer.  According to the patient, he has had intermittent rectal bleeding for the past 5-6 years.  However, over the past 2 months he began having uncontrollable diarrhea.  With each of these stools, blood loss was seen.  What particularly concerned his wife was his progressive weight loss, fatigue, and irritability.  She actually pushed for him to be seen by her gastroenterologist, who performed a colonoscopy in late July 2025.  This study showed a mass in his rectal region, which was biopsy-proven to be adenocarcinoma.  This patient had additional scans, including a rectal MRI, which showed his mass extending clearly through his rectal wall.  There were also at least 6 pathologic lymph nodes in the region that were suspicious for nodal metastasis.  This patient also had an abdominal MRI which showed a 4.4 cm lesion in his liver that was highly suspicious for metastatic disease.  He comes in today to go over these results and their implications.  Since the beginning of this calendar year, the patient believes he has lost over 30 pounds.  PAST MEDICAL HISTORY:   Past Medical History:  Diagnosis Date   Arthritis    Colon cancer (HCC)    Diarrhea    GERD (gastroesophageal reflux disease)    Hiatal hernia    History of basal cell carcinoma (BCC) excision    RIGHT FACE   History of renal calculi    Pneumonia    UTI (urinary tract infection)     PAST SURGICAL HISTORY:   Past Surgical History:  Procedure Laterality Date   COLONOSCOPY W/ POLYPECTOMY     SKIN CANCER EXCISION     BASAL CELL OF RIGHT FACE    CURRENT MEDICATIONS:   Current Outpatient Medications  Medication Sig Dispense Refill    fluticasone  (FLONASE ) 50 MCG/ACT nasal spray Place 1 spray into both nostrils 2 (two) times daily as needed for rhinitis. 17 mL 0   hydrocortisone  (ANUSOL -HC) 2.5 % rectal cream Place 1 Application rectally 2 (two) times daily. 30 g 1   loperamide (IMODIUM A-D) 2 MG tablet Take 2 mg by mouth 4 (four) times daily as needed for diarrhea or loose stools.     mirtazapine  (REMERON ) 15 MG tablet Take 1 tablet (15 mg total) by mouth at bedtime. 30 tablet 0   omeprazole  (PRILOSEC) 40 MG capsule Take 1 capsule (40 mg total) by mouth 2 (two) times daily for 14 days. 28 capsule 0   ondansetron  (ZOFRAN ) 4 MG tablet Take 1 tablet (4 mg total) by mouth every 8 (eight) hours as needed for nausea or vomiting. 30 tablet 1   pantoprazole  (PROTONIX ) 40 MG tablet Take 1 tablet (40 mg total) by mouth daily. 90 tablet 0   testosterone cypionate (DEPOTESTOSTERONE CYPIONATE) 200 MG/ML injection Inject 200 mg into the muscle once a week.     Current Facility-Administered Medications  Medication Dose Route Frequency Provider Last Rate Last Admin   0.9 %  sodium chloride  infusion  500 mL Intravenous Once Charlanne Groom, MD        ALLERGIES:  No Known Allergies  FAMILY HISTORY:   Family History  Problem Relation Age of Onset   Colitis Sister  Aortic aneurysm Brother    Colon cancer Maternal Uncle    Prostate cancer Maternal Uncle    Prostate cancer Paternal Uncle    Colon cancer Paternal Grandmother     SOCIAL HISTORY:  The patient was born and raised in [place].  [place] his wife of 30 years.  They have 3 children and 5 grandchildren.  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  He did smoke a pack of cigarettes daily for 14 years before quitting 30 years ago.  However, he does dip tobacco.  He denies a history of alcohol abuse.  REVIEW OF SYSTEMS:  Review of Systems  Constitutional:  Positive for unexpected weight change. Negative for fatigue and fever.  Respiratory:  Negative for  chest tightness, cough, hemoptysis and shortness of breath.   Cardiovascular:  Negative for chest pain and palpitations.  Gastrointestinal:  Positive for blood in stool and diarrhea. Negative for abdominal distention, abdominal pain, constipation, nausea and vomiting.  Genitourinary:  Negative for dysuria, frequency and hematuria.   Musculoskeletal:  Positive for arthralgias. Negative for back pain and myalgias.  Skin:  Negative for itching and rash.  Neurological:  Negative for dizziness, headaches and light-headedness.  Psychiatric/Behavioral:  Negative for depression and suicidal ideas. The patient is not nervous/anxious.      PHYSICAL EXAM:  Blood pressure 130/73, pulse 62, temperature 98.7 F (37.1 C), temperature source Oral, resp. rate 16, height 5' 7 (1.702 m), weight 168 lb 14.4 oz (76.6 kg), SpO2 99%. Wt Readings from Last 3 Encounters:  12/19/23 168 lb 14.4 oz (76.6 kg)  11/27/23 183 lb (83 kg)  10/15/23 183 lb 6.4 oz (83.2 kg)   Body mass index is 26.45 kg/m. Performance status (ECOG): 1 - Symptomatic but completely ambulatory Physical Exam Constitutional:      Appearance: Normal appearance. He is not ill-appearing.  HENT:     Mouth/Throat:     Mouth: Mucous membranes are moist.     Pharynx: Oropharynx is clear. No oropharyngeal exudate or posterior oropharyngeal erythema.  Cardiovascular:     Rate and Rhythm: Normal rate and regular rhythm.     Heart sounds: No murmur heard.    No friction rub. No gallop.  Pulmonary:     Effort: Pulmonary effort is normal. No respiratory distress.     Breath sounds: Normal breath sounds. No wheezing, rhonchi or rales.  Abdominal:     General: Bowel sounds are normal. There is no distension.     Palpations: Abdomen is soft. There is no mass.     Tenderness: There is no abdominal tenderness.  Musculoskeletal:        General: No swelling.     Right lower leg: No edema.     Left lower leg: No edema.  Lymphadenopathy:      Cervical: No cervical adenopathy.     Upper Body:     Right upper body: No supraclavicular or axillary adenopathy.     Left upper body: No supraclavicular or axillary adenopathy.     Lower Body: No right inguinal adenopathy. No left inguinal adenopathy.  Skin:    General: Skin is warm.     Coloration: Skin is not jaundiced.     Findings: No lesion or rash.  Neurological:     General: No focal deficit present.     Mental Status: He is alert and oriented to person, place, and time. Mental status is at baseline.  Psychiatric:  Mood and Affect: Mood normal.        Behavior: Behavior normal.        Thought Content: Thought content normal.     LABS:      Latest Ref Rng & Units 12/19/2023   10:23 AM 11/27/2023    3:44 PM 10/15/2023    2:29 PM  CBC  WBC 4.0 - 10.5 K/uL 5.4  5.6  6.4   Hemoglobin 13.0 - 17.0 g/dL 84.7  85.2  85.4   Hematocrit 39.0 - 52.0 % 47.4  45.4  44.5   Platelets 150 - 400 K/uL 272  245.0  307.0       Latest Ref Rng & Units 12/19/2023   10:23 AM 11/27/2023    3:44 PM 10/15/2023    2:29 PM  CMP  Glucose 70 - 99 mg/dL 898  74  86   BUN 6 - 20 mg/dL 15  9  12    Creatinine 0.61 - 1.24 mg/dL 9.05  8.87  8.83   Sodium 135 - 145 mmol/L 140  139  139   Potassium 3.5 - 5.1 mmol/L 4.4  4.1  3.8   Chloride 98 - 111 mmol/L 104  103  103   CO2 22 - 32 mmol/L 24  27  30    Calcium 8.9 - 10.3 mg/dL 9.7  8.6  9.1   Total Protein 6.5 - 8.1 g/dL 7.3  6.6  6.9   Total Bilirubin 0.0 - 1.2 mg/dL 0.4  0.7  0.4   Alkaline Phos 38 - 126 U/L 56  42  54   AST 15 - 41 U/L 12  9  9    ALT 0 - 44 U/L 8  10  7     Lab Results  Component Value Date   CEA 6.9 (H) 11/27/2023   PATHOLOGY (11-27-23): FINAL DIAGNOSIS        1. Duodenum, Biopsy,  :       - DUODENAL MUCOSA WITH NO SPECIFIC HISTOPATHOLOGIC CHANGES       - NEGATIVE FOR INCREASED INTRAEPITHELIAL LYMPHOCYTES OR VILLOUS ARCHITECTURAL       CHANGES        2. Stomach, biopsy,  :       - GASTRIC ANTRAL AND OXYNTIC MUCOSA  WITH CHRONIC GASTRITIS, SEE NOTE        3. Ascending  Colon Polyp, x 1 :       - TUBULAR ADENOMA(S)       - NEGATIVE FOR HIGH-GRADE DYSPLASIA OR MALIGNANCY        4. Rectum, biopsy, circumferential apple core mass :       - INVASIVE MODERATELY DIFFERENTIATED ADENOCARCINOMA, SEE COMMENT    STUDIES:  MR PELVIS W WO CONTRAST Result Date: 12/17/2023 CLINICAL DATA:  Rectal carcinoma.  Local staging. EXAM: MRI PELVIS WITHOUT AND WITH CONTRAST TECHNIQUE: Multiplanar multisequence MR imaging of the pelvis was performed both before and after administration of intravenous contrast. Ultrasound gel was administered per rectum to optimize tumor evaluation. CONTRAST:  7.5mL GADAVIST  GADOBUTROL  1 MMOL/ML IV SOLN COMPARISON:  None Available. FINDINGS: TUMOR LOCATION Tumor distance from Anal Verge/Skin surface: 6.3 cm Tumor distance to Internal Anal sphincter: 1.9 cm TUMOR DESCRIPTION Circumferential extent: 100% Tumor Size and volume: 6.0 cm in length, by 4.2 x 4.3 cm; tumor shows marked T2 hyperintensity consistent with mucinous adenocarcinoma T - CATEGORY Extension through [employer]: yes, from 12-3 o'clock positions, measuring up to 18 mm = T3d Shortest Distance of any tumor/node from  Mesorectal fascia: 0 cm; lymph node in the left posterior perirectal space contacts the Extramural Vascular Invasion/Tumor Thrombus: Yes, in the left anterolateral perirectal space (e.g. Image 30/5). Invasion of Anterior Peritoneal Reflection: No Involvement of Adjacent Organs or Pelvic Sidewall: No Levator Ani Involvement: No N - CATEGORY Mesorectal Lymph Nodes >=47mm: Numerous enlarged mesorectal lymph nodes throughout the perirectal space, largest measuring 11 mm on image 23/6; 4 or more=N2 Extra-mesorectal Lymphadenopathy: No Other: Small bilateral fat-containing inguinal hernias. IMPRESSION: Rectal adenocarcinoma T stage: T3d Rectal adenocarcinoma N stage:  N2 Distance from tumor to the internal anal sphincter is 1.9 cm.  Electronically Signed   By: Norleen DELENA Kil M.D.   On: 12/17/2023 16:11   MR ABDOMEN W WO CONTRAST Result Date: 12/16/2023 CLINICAL DATA:  Rectal cancer, liver lesion suspicious for metastasis identified by prior CT EXAM: MRI ABDOMEN WITHOUT AND WITH CONTRAST TECHNIQUE: Multiplanar multisequence MR imaging of the abdomen was performed both before and after the administration of intravenous contrast. CONTRAST:  8mL GADAVIST  GADOBUTROL  1 MMOL/ML IV SOLN COMPARISON:  CT chest abdomen pelvis, 11/30/2023 FINDINGS: Lower chest: No acute abnormality. Hepatobiliary: Rim enhancing, diffusion restricting mass in anterior hepatic segment V measuring 4.3 x 4.2 cm (series 1001, image 78). Multiple additional tiny fluid signal lesions scattered throughout the liver, probably tiny cysts. No gallstones, gallbladder wall thickening, or biliary dilatation. Pancreas: Unremarkable. No pancreatic ductal dilatation or surrounding inflammatory changes. Spleen: Normal in size without significant abnormality. Adrenals/Urinary Tract: Adrenal glands are unremarkable. Kidneys are normal, without renal calculi, solid lesion, or hydronephrosis. Stomach/Bowel: Stomach is within normal limits. No evidence of bowel wall thickening, distention, or inflammatory changes. Vascular/Lymphatic: No significant vascular findings are present. No enlarged abdominal lymph nodes. Other: No abdominal wall hernia or abnormality. No ascites. Musculoskeletal: No acute or significant osseous findings. IMPRESSION: 1. Rim enhancing, diffusion restricting mass in anterior hepatic segment V measuring 4.3 x 4.2 cm, consistent with hepatic metastasis. 2. Multiple additional tiny fluid signal lesions scattered throughout the liver, probably tiny cysts but warranting attention on follow-up. 3. No other evidence of metastatic disease in the abdomen. Electronically Signed   By: Marolyn JONETTA Jaksch M.D.   On: 12/16/2023 16:47   CT CHEST ABDOMEN PELVIS W CONTRAST Result Date:  11/30/2023 CLINICAL DATA:  Rectal adenocarcinoma staging. Right upper quadrant abdominal pain. Chronic gastritis. * Tracking Code: BO * EXAM: CT CHEST, ABDOMEN, AND PELVIS WITH CONTRAST TECHNIQUE: Multidetector CT imaging of the chest, abdomen and pelvis was performed following the standard protocol during bolus administration of intravenous contrast. RADIATION DOSE REDUCTION: This exam was performed according to the departmental dose-optimization program which includes automated exposure control, adjustment of the mA and/or kV according to patient size and/or use of iterative reconstruction technique. CONTRAST:  OMNIPAQUE  IOHEXOL  300 MG/ML  SOLN COMPARISON:  Right upper quadrant ultrasound 10/26/2023. FINDINGS: CT CHEST FINDINGS Cardiovascular: Mild atheromatous vascular calcification of the aortic arch. Ascending aortic aneurysm 4.0 cm in diameter, image 29 series 301. Mediastinum/Nodes: 1.1 cm hypodense left thyroid nodule on image 10 series 301. Not clinically significant; no follow-up imaging recommended (ref: J Am Coll Radiol. 2015 Feb;12(2): 143-50). Lungs/Pleura: 3 mm right upper lobe nodule on image 28 series 302. 3 by 2 mm subpleural nodule in the right lower lobe on image 44 series 302. 3 mm right lower lobe nodule on image 59 series 302. 3 mm right middle lobe nodule on image 81 series 302. 5 by 3 mm right lower lobe nodule on image 85 series 302. Musculoskeletal: Bilateral degenerative  glenohumeral arthropathy. Thoracic spondylosis. CT ABDOMEN PELVIS FINDINGS Hepatobiliary: 3.7 by 3.3 cm mass in segment 5 of the liver was mostly echogenic by ultrasound but does not have specific characteristics of hemangioma on CT evaluation. Although nonspecific, concern is raised for the possibility of metastatic lesion given the presence of the rectal mass. This could be further worked up with hepatic protocol MRI with and without contrast or PET-CT. Three subcentimeter hypodense lesions in segment 2 of the liver  are too small to characterize. Gallbladder unremarkable. Pancreas: Unremarkable Spleen: Unremarkable Adrenals/Urinary Tract: 8 mm left kidney lower pole nonobstructive renal calculus. Otherwise unremarkable. Stomach/Bowel: Scattered diverticula of the sigmoid colon. Circumferential rectal mass extending over about 5.5 cm in length, with adjacent perirectal adenopathy. Possible local extension of tumor into the perirectal space along the left anterior border on image 107 series 301. Index left posterior perirectal node 1.2 cm in short axis on image 101 series 301. Superior anorectal lymph nodes clustered on images 96-97 of series 301, with the more posterior node measuring 0.9 cm in short axis. Vascular/Lymphatic: Perirectal and superior anorectal adenopathy is noted above. Reproductive: Unremarkable Other: No ascites or nodularity along the peritoneal/omental surfaces. Musculoskeletal: Mild levoconvex lumbar scoliosis. Mild degenerative arthropathy of both hips. IMPRESSION: 1. Circumferential rectal mass extending over about 5.5 cm in length, with adjacent pathologic perirectal and superior anorectal adenopathy. Possible local extension of tumor into the perirectal space along the left anterior border. 2. 3.7 by 3.3 cm mass in segment 5 of the liver was mostly echogenic by ultrasound but does not have specific characteristics of hemangioma on CT evaluation. Although nonspecific, concern is raised for the possibility of metastatic lesion given the presence of the rectal mass. This could be further worked up with hepatic protocol MRI with and without contrast or PET-CT. 3. Three subcentimeter hypodense lesions in segment 2 of the liver are too small to characterize. 4. Several tiny pulmonary nodules are present, the largest measuring 5 by 3 mm in the right lower lobe. These are nonspecific and too small for accurate characterization by PET-CT. Surveillance suggested in the context of the patient's follow up oncology  imaging. 5. Ascending aortic aneurysm 4.0 cm in diameter. Recommend annual imaging followup by CTA or MRA, or alternatively in the context of the patient's follow up oncology imaging. This recommendation follows 2010 ACCF/AHA/AATS/ACR/ASA/SCA/SCAI/SIR/STS/SVM Guidelines for the Diagnosis and Management of Patients with Thoracic Aortic Disease. Circulation. 2010; 121: [id number]. Aortic aneurysm NOS (ICD10-I71.9) 6. 8 mm left kidney lower pole nonobstructive renal calculus. 7. Bilateral degenerative glenohumeral arthropathy. 8. Mild levoconvex lumbar scoliosis. 9. Mild degenerative arthropathy of both hips. 10.  Aortic Atherosclerosis (ICD10-I70.0). Electronically Signed   By: Ryan Salvage M.D.   On: 11/30/2023 13:48     ASSESSMENT & PLAN:  A 58 y.o. male who I was asked to consult upon for what clinically appears to be stage IVA (T3 N2a M1a) rectal cancer.  In clinic today, I went over all of his MRI/CT scan images with him, for which he could see the lesion in his liver that is highly concerning for metastatic disease.  There are also other smaller lesions within his liver that are just not large enough to definitively determine what they may represent.  Over these next few weeks, I will have this patient undergo a liver biopsy to prove that the largest lesion in question represents metastatic disease.  I will also have this patient undergo a PET scan to ensure he has no other areas of metastatic  disease outside of the isolated lesion in his liver.  If not, I will could theoretically use systemic chemotherapy to bring down the sizes of both his primary rectal cancer and his metastatic liver lesion before potentially having both resected to hopefully make him NED.  However, if his PET scan unexpectedly shows other areas of metastatic disease, then the ultimate goal would be to give him palliative systemic chemotherapy to keep his disease under control for as long as possible.  I will see this patient back  in 2 weeks to go over both his ultrasound-guided liver biopsy and PET scan results, as well as their implications.  I will also have this gentleman's rectal cancer sent to [facility] to determine if his disease harbors any mutations/alterations for which targeted therapy could also be considered for his disease management.  Although somewhat despondent, the patient understands all the plans discussed today and is in agreement with them.  I do appreciate Silver Lamar LABOR, MD for his new consult.   Shakaya Bhullar LABOR [place], MD

## 2023-12-19 ENCOUNTER — Other Ambulatory Visit: Payer: Self-pay | Admitting: Oncology

## 2023-12-19 ENCOUNTER — Inpatient Hospital Stay: Payer: Self-pay

## 2023-12-19 ENCOUNTER — Inpatient Hospital Stay: Payer: Self-pay | Attending: Oncology | Admitting: Oncology

## 2023-12-19 ENCOUNTER — Telehealth: Payer: Self-pay | Admitting: Oncology

## 2023-12-19 ENCOUNTER — Encounter: Payer: Self-pay | Admitting: Oncology

## 2023-12-19 VITALS — BP 130/73 | HR 62 | Temp 98.7°F | Resp 16 | Ht 67.0 in | Wt 168.9 lb

## 2023-12-19 DIAGNOSIS — C20 Malignant neoplasm of rectum: Secondary | ICD-10-CM | POA: Insufficient documentation

## 2023-12-19 DIAGNOSIS — Z8042 Family history of malignant neoplasm of prostate: Secondary | ICD-10-CM | POA: Insufficient documentation

## 2023-12-19 DIAGNOSIS — Z79899 Other long term (current) drug therapy: Secondary | ICD-10-CM | POA: Insufficient documentation

## 2023-12-19 DIAGNOSIS — K769 Liver disease, unspecified: Secondary | ICD-10-CM | POA: Insufficient documentation

## 2023-12-19 DIAGNOSIS — Z800 Family history of malignant neoplasm of digestive organs: Secondary | ICD-10-CM | POA: Insufficient documentation

## 2023-12-19 DIAGNOSIS — R599 Enlarged lymph nodes, unspecified: Secondary | ICD-10-CM | POA: Insufficient documentation

## 2023-12-19 DIAGNOSIS — Z87891 Personal history of nicotine dependence: Secondary | ICD-10-CM | POA: Insufficient documentation

## 2023-12-19 LAB — CBC WITH DIFFERENTIAL (CANCER CENTER ONLY)
Abs Immature Granulocytes: 0.01 K/uL (ref 0.00–0.07)
Basophils Absolute: 0 K/uL (ref 0.0–0.1)
Basophils Relative: 1 %
Eosinophils Absolute: 0.2 K/uL (ref 0.0–0.5)
Eosinophils Relative: 3 %
HCT: 47.4 % (ref 39.0–52.0)
Hemoglobin: 15.2 g/dL (ref 13.0–17.0)
Immature Granulocytes: 0 %
Lymphocytes Relative: 23 %
Lymphs Abs: 1.2 K/uL (ref 0.7–4.0)
MCH: 27.7 pg (ref 26.0–34.0)
MCHC: 32.1 g/dL (ref 30.0–36.0)
MCV: 86.5 fL (ref 80.0–100.0)
Monocytes Absolute: 0.3 K/uL (ref 0.1–1.0)
Monocytes Relative: 6 %
Neutro Abs: 3.6 K/uL (ref 1.7–7.7)
Neutrophils Relative %: 67 %
Platelet Count: 272 K/uL (ref 150–400)
RBC: 5.48 MIL/uL (ref 4.22–5.81)
RDW: 16.1 % — ABNORMAL HIGH (ref 11.5–15.5)
WBC Count: 5.4 K/uL (ref 4.0–10.5)
nRBC: 0 % (ref 0.0–0.2)

## 2023-12-19 LAB — CMP (CANCER CENTER ONLY)
ALT: 8 U/L (ref 0–44)
AST: 12 U/L — ABNORMAL LOW (ref 15–41)
Albumin: 4.1 g/dL (ref 3.5–5.0)
Alkaline Phosphatase: 56 U/L (ref 38–126)
Anion gap: 12 (ref 5–15)
BUN: 15 mg/dL (ref 6–20)
CO2: 24 mmol/L (ref 22–32)
Calcium: 9.7 mg/dL (ref 8.9–10.3)
Chloride: 104 mmol/L (ref 98–111)
Creatinine: 0.94 mg/dL (ref 0.61–1.24)
GFR, Estimated: >60 mL/min (ref >60)
Glucose, Bld: 101 mg/dL — ABNORMAL HIGH (ref 70–99)
Potassium: 4.4 mmol/L (ref 3.5–5.1)
Sodium: 140 mmol/L (ref 135–145)
Total Bilirubin: 0.4 mg/dL (ref 0.0–1.2)
Total Protein: 7.3 g/dL (ref 6.5–8.1)

## 2023-12-19 LAB — IRON AND TIBC
Iron: 98 ug/dL (ref 45–182)
Saturation Ratios: 26 % (ref 17.9–39.5)
TIBC: 375 ug/dL (ref 250–450)
UIBC: 277 ug/dL

## 2023-12-19 LAB — FERRITIN: Ferritin: 19 ng/mL — ABNORMAL LOW (ref 24–336)

## 2023-12-19 NOTE — Telephone Encounter (Signed)
 12/19/23 Spoke with patient and confirmed PET SCAN appt on 12/24/23@930am -HP

## 2023-12-27 ENCOUNTER — Other Ambulatory Visit: Payer: Self-pay

## 2023-12-27 NOTE — Progress Notes (Signed)
 This information is for discussion at tumor conference and not part of the official treatment plan.

## 2024-01-02 NOTE — Progress Notes (Unsigned)
 [facility] at [facility] [address] [place],  [place]  [address] [phone]  Clinic Day:  01/03/2024  Referring physician: Silver Lamar LABOR, MD   HISTORY OF PRESENT ILLNESS:  The patient is a 58 y.o. 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  Since his last visit, the patient continues to do well.  He denies having any symptoms which concern him for overt disease progression.    PHYSICAL EXAM:  There were no vitals taken for this visit. Wt Readings from Last 3 Encounters:  12/19/23 168 lb 14.4 oz (76.6 kg)  11/27/23 183 lb (83 kg)  10/15/23 183 lb 6.4 oz (83.2 kg)   There is no height or weight on file to calculate BMI. Performance status (ECOG): 1 - Symptomatic but completely ambulatory Physical Exam Constitutional:      Appearance: Normal appearance. He is not ill-appearing.  HENT:     Mouth/Throat:     Mouth: Mucous membranes are moist.     Pharynx: Oropharynx is clear. No oropharyngeal exudate or posterior oropharyngeal erythema.  Cardiovascular:     Rate and Rhythm: Normal rate and regular rhythm.     Heart sounds: No murmur heard.    No friction rub. No gallop.  Pulmonary:     Effort: Pulmonary effort is normal. No respiratory distress.     Breath sounds: Normal breath sounds. No wheezing, rhonchi or rales.  Abdominal:     General: Bowel sounds are normal. There is no distension.     Palpations: Abdomen is soft. There is no mass.     Tenderness: There is no abdominal tenderness.  Musculoskeletal:        General: No swelling.     Right lower leg: No edema.     Left lower leg: No edema.  Lymphadenopathy:     Cervical: No cervical adenopathy.     Upper Body:     Right upper body: No  supraclavicular or axillary adenopathy.     Left upper body: No supraclavicular or axillary adenopathy.     Lower Body: No right inguinal adenopathy. No left inguinal adenopathy.  Skin:    General: Skin is warm.     Coloration: Skin is not jaundiced.     Findings: No lesion or rash.  Neurological:     General: No focal deficit present.     Mental Status: He is alert and oriented to person, place, and time. Mental status is at baseline.  Psychiatric:        Mood and Affect: Mood normal.        Behavior: Behavior normal.        Thought Content: Thought content normal.     LABS:      Latest Ref Rng & Units 12/19/2023   10:23 AM 11/27/2023    3:44 PM 10/15/2023    2:29 PM  CBC  WBC 4.0 - 10.5 K/uL 5.4  5.6  6.4   Hemoglobin 13.0 - 17.0 g/dL 84.7  85.2  85.4   Hematocrit 39.0 - 52.0 % 47.4  45.4  44.5   Platelets 150 - 400 K/uL 272  245.0  307.0       Latest Ref Rng & Units 12/19/2023  10:23 AM 11/27/2023    3:44 PM 10/15/2023    2:29 PM  CMP  Glucose 70 - 99 mg/dL 898  74  86   BUN 6 - 20 mg/dL 15  9  12    Creatinine 0.61 - 1.24 mg/dL 9.05  8.87  8.83   Sodium 135 - 145 mmol/L 140  139  139   Potassium 3.5 - 5.1 mmol/L 4.4  4.1  3.8   Chloride 98 - 111 mmol/L 104  103  103   CO2 22 - 32 mmol/L 24  27  30    Calcium 8.9 - 10.3 mg/dL 9.7  8.6  9.1   Total Protein 6.5 - 8.1 g/dL 7.3  6.6  6.9   Total Bilirubin 0.0 - 1.2 mg/dL 0.4  0.7  0.4   Alkaline Phos 38 - 126 U/L 56  42  54   AST 15 - 41 U/L 12  9  9    ALT 0 - 44 U/L 8  10  7     Lab Results  Component Value Date   CEA 6.9 (H) 11/27/2023   PATHOLOGY (11-27-23): FINAL DIAGNOSIS        1. Duodenum, Biopsy,  :       - DUODENAL MUCOSA WITH NO SPECIFIC HISTOPATHOLOGIC CHANGES       - NEGATIVE FOR INCREASED INTRAEPITHELIAL LYMPHOCYTES OR VILLOUS ARCHITECTURAL       CHANGES        2. 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 Ascending  Colon Polyp, x 1 :        - TUBULAR ADENOMA(S)       - NEGATIVE FOR HIGH-GRADE DYSPLASIA OR MALIGNANCY        4. Rectum, biopsy, circumferential apple core mass :       - INVASIVE MODERATELY DIFFERENTIATED ADENOCARCINOMA, SEE COMMENT    STUDIES: His PET scan from 12/24/2023 revealed the following:  FINDINGS: Mediastinal blood-pool activity (background): SUV max = 2.2  Liver activity (reference): SUV max = N/A  NECK: No hypermetabolic lymph nodes or masses.  Incidental CT findings: None.  CHEST: No hypermetabolic lymph nodes. No suspicious pulmonary nodules seen on CT images.  Incidental CT findings: None.  ABDOMEN/PELVIS: 5 cm low-attenuation mass near junction of right and left hepatic lobes shows peripheral hypermetabolism with central necrosis. This has SUV max of 9.6, consistent liver metastasis. No other hypermetabolic liver lesions are seen. No abnormal hypermetabolic activity within the pancreas, adrenal glands, or spleen.  Circumferential rectal mass is seen which shows hypermetabolism, with SUV max of 27.4, consistent with rectal carcinoma. Extramural vascular invasion is again seen in the left anterior perirectal region which is hypermetabolic.  Mildly hypermetabolic mesorectal lymph nodes are seen in the left lateral and posterior perirectal space and sigmoid mesocolon, with SUV max measuring up to 3.9. Largest lymph node measures 10 mm in the left perirectal space. These are consistent with local lymph node metastases. No hypermetabolic extra-mesorectal lymph nodes seen.  Incidental CT findings: 11 mm nonobstructing left renal calculus.  SKELETON: No focal hypermetabolic bone lesions to suggest skeletal metastasis.  Incidental CT findings: None.  IMPRESSION: Hypermetabolic circumferential rectal mass, consistent with primary rectal carcinoma.  Local lymph node metastases in the mesorectal space and sigmoid mesocolon.  5 cm hypermetabolic metastasis in the left hepatic  lobe.  No evidence of metastatic disease in the neck  or chest.  11 mm nonobstructing left renal calculus.  ASSESSMENT & PLAN:  A 58 y.o. male with what clinically appears to be stage IVA (T3 N2a M1a) rectal cancer.  In clinic today, I went over all of his PET scan images with him, for which it appears he only has 1 metastatic liver lesion, which qualifies him as having oligometastatic disease.  Based upon this finding, I do want to give this gentleman neoadjuvant chemotherapy for cytoreductive purposes in hopes of making him eligible for surgery that could potentially remove both his rectal and liver mass concurrently to make him disease-free.  I will place him on 6 cycles of neoadjuvant FOLFOX chemotherapy, with the first cycle to be given on Monday, September 15th.  The patient was made aware of the side effects which can go along with this chemotherapy regimen, including peripheral neuropathy, cold intolerance, cytopenias, and GI toxicity.  I will arrange for him to have a port placed through which all of his future IV chemotherapy will be given.  I will see him back in late September before he heads into his second cycle of neoadjuvant FOLFOX chemotherapy.  I will also discuss his case with surgery to ensure they are on the same page with respect to his presumptive treatment plan.  The patient understands all the plans discussed today and is in agreement with them.  Natividad Schlosser DELENA Kerns, MD

## 2024-01-03 ENCOUNTER — Inpatient Hospital Stay: Payer: Self-pay | Attending: Oncology | Admitting: Oncology

## 2024-01-03 ENCOUNTER — Inpatient Hospital Stay: Payer: Self-pay

## 2024-01-03 DIAGNOSIS — C787 Secondary malignant neoplasm of liver and intrahepatic bile duct: Secondary | ICD-10-CM | POA: Insufficient documentation

## 2024-01-03 DIAGNOSIS — Z79899 Other long term (current) drug therapy: Secondary | ICD-10-CM | POA: Insufficient documentation

## 2024-01-03 DIAGNOSIS — C20 Malignant neoplasm of rectum: Secondary | ICD-10-CM | POA: Insufficient documentation

## 2024-01-03 DIAGNOSIS — Z5111 Encounter for antineoplastic chemotherapy: Secondary | ICD-10-CM | POA: Insufficient documentation

## 2024-01-03 NOTE — Progress Notes (Signed)
 [facility] Clinical Social Work  Initial Assessment   Robert Mitchell is a 58 y.o. year old male accompanied by patient, daughter, and wife. Clinical Social Work was referred by nurse for assessment of psychosocial needs.   SDOH (Social Determinants of Health) assessments performed: No   SDOH Screenings   Food Insecurity: No Food Insecurity (12/19/2023)  Housing: Unknown (12/19/2023)  Transportation Needs: No Transportation Needs (12/19/2023)  Utilities: Not At Risk (12/19/2023)  Depression (PHQ2-9): Low Risk  (01/03/2024)  Tobacco Use: High Risk (12/19/2023)    PHQ 2/9:    01/03/2024    3:00 PM 12/19/2023   11:34 AM 12/19/2023   11:00 AM  Depression screen PHQ 2/9  Decreased Interest 0 2 0  Down, Depressed, Hopeless 0 0 0  PHQ - 2 Score 0 2 0     Distress Screen completed: No    12/19/2023   11:21 AM  ONCBCN DISTRESS SCREENING  Screening Type Initial Screening  How much distress have you been experiencing in the past week? (0-10) 3      Family/Social Information:  Housing Arrangement: patient lives with his spouse in [place].  Family members/support persons in your life? Max was present with his spouse and one of three daughter (all of which who live local). Sherrill's family discussed how his daughters / spouse are apart of their support system. Transportation concerns: Yanni's spouse will be transporting him to appointments. No transportation barriers noted.   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 However, due to the diagnosis and the effects (fatigue, weight loss) Alcario has had to reduce the amount that he is able to work directly impacting income. Income source: Employment Financial concerns: Yes, due to illness and/or loss of work during treatment Type of concern: Medical bills / Living Expenses  Food access concerns: no Religious or spiritual practice: Not known Advanced directives: No   Coping/ Adjustment to  diagnosis: Patient understands treatment plan and what happens next? yes, Daeveon understands the purpose of today's visit and the two options.  Concerns about diagnosis and/or treatment: Octave described a mix bag of emotions being up and down. Family members are taking it day by day as they adjust to the diagnosis. CSW explained the ways this can impact emotions / families  Patient reported stressors: Work/ school, Finances, and Adjusting to my illness Patient enjoys being outside - Camping! Current coping skills/ strengths: Ability for insight , Active sense of humor , Average or above average intelligence , Capable of independent living , Communication skills , General fund of knowledge , Motivation for treatment/growth , Physical Health , Special hobby/interest , and Supportive family/friends     SUMMARY: Current SDOH Barriers:  Financial constraints related to Medical Bills  Clinical Social Work Clinical Goal(s):  Explore community resource options for unmet needs related to:  Financial Strain due to loss of income (interventions below)  Interventions: Introduced myself as a member of their support team that works alongside Development worker, community. Discussed common feeling and emotions when being diagnosed with cancer, and the importance of support during treatment Discussed [employer]. Sent referral to Patient Services Navigator who will assist with navigating process.  Discussed [facility] Assistance, encouraged family to call [facility] to discuss financial assistance options.  Discussed cost of treatment, sent referral to patient services navigator to coordinate cost of treatment estimates. Patient understands once treatment plan is entered, Pharmacy Assistance team can assess for assistance. Sent referral to patient service navigator for  further conversations.  Informed patient of the support team roles and support services at [facility] Provided CSW / Patient services  navigator contact information and encouraged patient to call with any questions or concerns   Follow Up Plan: Patient will contact CSW with any support or resource needs and CSW will follow up if guidance is learned about information above.  Patient verbalizes understanding of plan: Yes    Lizbeth Sprague, LCSW Clinical Social Worker [facility]

## 2024-01-03 NOTE — Progress Notes (Signed)
START ON PATHWAY REGIMEN - Colorectal     A cycle is every 14 days:     Oxaliplatin      Leucovorin      Fluorouracil      Fluorouracil   **Always confirm dose/schedule in your pharmacy ordering system**  Patient Characteristics: Distant Metastases, Resectable, Neoadjuvant Therapy Planned Tumor Location: Rectal Therapeutic Status: Distant Metastases  Intent of Therapy: Curative Intent, Discussed with Patient 

## 2024-01-04 ENCOUNTER — Other Ambulatory Visit: Payer: Self-pay

## 2024-01-04 ENCOUNTER — Encounter: Payer: Self-pay | Admitting: Oncology

## 2024-01-04 ENCOUNTER — Telehealth: Payer: Self-pay | Admitting: Pharmacy Technician

## 2024-01-04 NOTE — Telephone Encounter (Signed)
 Spoke with patient and made aware that he would be receiving a Good Faith Estimate in the mail.  However, the charges only reflect one treatment and he would need to bear in mind that his current treatment plan consists of six treatments.  Patient verbally acknowledged that he understood.  Also, discussed how to apply for [facility] Financial Assistance along with the financial documentation that would be needed in order to apply.  [name] an application to complete.  Once completed, patient will return along with requested financial information to the [facility].  Patient stated that he was unsure of his adjusted gross income.  Is to contact me with that information so that I can determine if he is eligible for the [employer].  Robert Mitchell Patient Pharmacologist [facility]

## 2024-01-05 ENCOUNTER — Other Ambulatory Visit: Payer: Self-pay

## 2024-01-08 ENCOUNTER — Encounter: Payer: Self-pay | Admitting: Oncology

## 2024-01-09 ENCOUNTER — Other Ambulatory Visit: Payer: Self-pay | Admitting: Physician Assistant

## 2024-01-09 ENCOUNTER — Inpatient Hospital Stay: Payer: Self-pay | Admitting: Hematology and Oncology

## 2024-01-10 ENCOUNTER — Inpatient Hospital Stay: Payer: Self-pay

## 2024-01-10 ENCOUNTER — Inpatient Hospital Stay (HOSPITAL_BASED_OUTPATIENT_CLINIC_OR_DEPARTMENT_OTHER): Payer: Self-pay | Admitting: Hematology and Oncology

## 2024-01-10 ENCOUNTER — Other Ambulatory Visit: Payer: Self-pay

## 2024-01-10 VITALS — BP 124/72 | HR 54 | Resp 16 | Ht 67.0 in | Wt 175.4 lb

## 2024-01-10 DIAGNOSIS — C787 Secondary malignant neoplasm of liver and intrahepatic bile duct: Secondary | ICD-10-CM

## 2024-01-10 DIAGNOSIS — C20 Malignant neoplasm of rectum: Secondary | ICD-10-CM

## 2024-01-10 LAB — CMP (CANCER CENTER ONLY)
ALT: 7 U/L (ref 0–44)
AST: 14 U/L — ABNORMAL LOW (ref 15–41)
Albumin: 3.6 g/dL (ref 3.5–5.0)
Alkaline Phosphatase: 62 U/L (ref 38–126)
Anion gap: 10 (ref 5–15)
BUN: 10 mg/dL (ref 6–20)
CO2: 26 mmol/L (ref 22–32)
Calcium: 9 mg/dL (ref 8.9–10.3)
Chloride: 105 mmol/L (ref 98–111)
Creatinine: 0.88 mg/dL (ref 0.61–1.24)
GFR, Estimated: >60 mL/min (ref >60)
Glucose, Bld: 92 mg/dL (ref 70–99)
Potassium: 3.9 mmol/L (ref 3.5–5.1)
Sodium: 141 mmol/L (ref 135–145)
Total Bilirubin: 0.3 mg/dL (ref 0.0–1.2)
Total Protein: 6.5 g/dL (ref 6.5–8.1)

## 2024-01-10 LAB — CBC WITH DIFFERENTIAL (CANCER CENTER ONLY)
Abs Immature Granulocytes: 0.01 K/uL (ref 0.00–0.07)
Basophils Absolute: 0 K/uL (ref 0.0–0.1)
Basophils Relative: 1 %
Eosinophils Absolute: 0.2 K/uL (ref 0.0–0.5)
Eosinophils Relative: 4 %
HCT: 43 % (ref 39.0–52.0)
Hemoglobin: 13.8 g/dL (ref 13.0–17.0)
Immature Granulocytes: 0 %
Lymphocytes Relative: 31 %
Lymphs Abs: 1.9 K/uL (ref 0.7–4.0)
MCH: 27.9 pg (ref 26.0–34.0)
MCHC: 32.1 g/dL (ref 30.0–36.0)
MCV: 87 fL (ref 80.0–100.0)
Monocytes Absolute: 0.5 K/uL (ref 0.1–1.0)
Monocytes Relative: 8 %
Neutro Abs: 3.4 K/uL (ref 1.7–7.7)
Neutrophils Relative %: 56 %
Platelet Count: 239 K/uL (ref 150–400)
RBC: 4.94 MIL/uL (ref 4.22–5.81)
RDW: 16.5 % — ABNORMAL HIGH (ref 11.5–15.5)
WBC Count: 6 K/uL (ref 4.0–10.5)
nRBC: 0 % (ref 0.0–0.2)

## 2024-01-11 MED ORDER — PROCHLORPERAZINE MALEATE 10 MG PO TABS: 10.0000 mg | ORAL_TABLET | Freq: Four times a day (QID) | ORAL | 3 refills | Status: DC | PRN

## 2024-01-11 MED FILL — Fluorouracil IV Soln 5 GM/100ML (50 MG/ML): INTRAVENOUS | Qty: 100 | Status: AC

## 2024-01-11 MED FILL — Fluorouracil IV Soln 2.5 GM/50ML (50 MG/ML): INTRAVENOUS | Qty: 15 | Status: AC

## 2024-01-14 ENCOUNTER — Other Ambulatory Visit: Payer: Self-pay | Admitting: Hematology and Oncology

## 2024-01-14 ENCOUNTER — Inpatient Hospital Stay: Payer: Self-pay

## 2024-01-14 ENCOUNTER — Other Ambulatory Visit: Payer: Self-pay

## 2024-01-14 VITALS — BP 127/80 | HR 67 | Temp 98.0°F | Resp 18

## 2024-01-14 DIAGNOSIS — F17210 Nicotine dependence, cigarettes, uncomplicated: Secondary | ICD-10-CM

## 2024-01-14 DIAGNOSIS — C20 Malignant neoplasm of rectum: Secondary | ICD-10-CM

## 2024-01-14 MED ORDER — DEXTROSE 5 % IV SOLN: INTRAVENOUS | Status: DC

## 2024-01-14 MED ORDER — SODIUM CHLORIDE 0.9 % IV SOLN
2400.0000 mg/m2 | INTRAVENOUS | Status: DC
  Administered 2024-01-14: 5000 mg via INTRAVENOUS
  Filled 2024-01-14: qty 100

## 2024-01-14 MED ORDER — FLUOROURACIL CHEMO INJECTION 2.5 GM/50ML
400.0000 mg/m2 | Freq: Once | INTRAVENOUS | Status: AC
  Administered 2024-01-14: 750 mg via INTRAVENOUS
  Filled 2024-01-14: qty 15

## 2024-01-14 MED ORDER — SODIUM CHLORIDE 0.9% FLUSH: 10.0000 mL | INTRAVENOUS | Status: DC | PRN

## 2024-01-14 MED ORDER — NICOTINE 14 MG/24HR TD PT24: MEDICATED_PATCH | TRANSDERMAL | 0 refills | Status: DC

## 2024-01-14 MED ORDER — LEUCOVORIN CALCIUM INJECTION 350 MG
400.0000 mg/m2 | Freq: Once | INTRAVENOUS | Status: AC
  Administered 2024-01-14: 760 mg via INTRAVENOUS
  Filled 2024-01-14: qty 38

## 2024-01-14 MED ORDER — NICOTINE 7 MG/24HR TD PT24: MEDICATED_PATCH | TRANSDERMAL | 0 refills | Status: DC

## 2024-01-14 MED ORDER — NICOTINE 21 MG/24HR TD PT24: MEDICATED_PATCH | TRANSDERMAL | 0 refills | Status: DC

## 2024-01-14 MED ORDER — PALONOSETRON HCL INJECTION 0.25 MG/5ML
0.2500 mg | Freq: Once | INTRAVENOUS | Status: AC
  Administered 2024-01-14: 0.25 mg via INTRAVENOUS
  Filled 2024-01-14: qty 5

## 2024-01-14 MED ORDER — DEXAMETHASONE SODIUM PHOSPHATE 10 MG/ML IJ SOLN
10.0000 mg | Freq: Once | INTRAMUSCULAR | Status: AC
  Administered 2024-01-14: 10 mg via INTRAVENOUS
  Filled 2024-01-14: qty 1

## 2024-01-14 MED ORDER — LORAZEPAM 2 MG/ML IJ SOLN
1.0000 mg | Freq: Once | INTRAMUSCULAR | Status: AC
  Administered 2024-01-14: 1 mg via INTRAVENOUS
  Filled 2024-01-14: qty 1

## 2024-01-14 MED ORDER — OXALIPLATIN CHEMO INJECTION 100 MG/20ML
87.0000 mg/m2 | Freq: Once | INTRAVENOUS | Status: AC
  Administered 2024-01-14: 165 mg via INTRAVENOUS
  Filled 2024-01-14: qty 33

## 2024-01-14 NOTE — Progress Notes (Signed)
..  Pharmacist Chemotherapy Monitoring - Initial Assessment    Anticipated start date: 01/14/2024  The following has been reviewed per standard work regarding the patient's treatment regimen: The patient's diagnosis, treatment plan and drug doses, and organ/hematologic function Lab orders and baseline tests specific to treatment regimen  The treatment plan start date, drug sequencing, and pre-medications Prior authorization status - pt is self pay Patient's documented medication list, including drug-drug interaction screen and prescriptions for anti-emetics and supportive care specific to the treatment regimen The drug concentrations, fluid compatibility, administration routes, and timing of the medications to be used The patient's access for treatment and lifetime cumulative dose history, if applicable  The patient's medication allergies and previous infusion related reactions, if applicable   Changes made to treatment plan:  N/A  Follow up needed:  N/A   Robert Mitchell, RPH, 01/14/2024  8:54 AM

## 2024-01-14 NOTE — Patient Instructions (Signed)
 Fluorouracil Injection What is this medication? FLUOROURACIL (flure oh YOOR a sil) treats some types of cancer. It works by slowing down the growth of cancer cells. This medicine may be used for other purposes; ask your health care provider or pharmacist if you have questions. COMMON BRAND NAME(S): Adrucil What should I tell my care team before I take this medication? They need to know if you have any of these conditions: Blood disorders Dihydropyrimidine dehydrogenase (DPD) deficiency Infection, such as chickenpox, cold sores, herpes Kidney disease Liver disease Poor nutrition Recent or ongoing radiation therapy An unusual or allergic reaction to fluorouracil, other medications, foods, dyes, or preservatives If you or your partner are pregnant or trying to get pregnant Breast-feeding How should I use this medication? This medication is injected into a vein. It is administered by your care team in a hospital or clinic setting. Talk to your care team about the use of this medication in children. Special care may be needed. Overdosage: If you think you have taken too much of this medicine contact a poison control center or emergency room at once. NOTE: This medicine is only for you. Do not share this medicine with others. What if I miss a dose? Keep appointments for follow-up doses. It is important not to miss your dose. Call your care team if you are unable to keep an appointment. What may interact with this medication? Do not take this medication with any of the following: Live virus vaccines This medication may also interact with the following: Medications that treat or prevent blood clots, such as warfarin, enoxaparin, dalteparin This list may not describe all possible interactions. Give your health care provider a list of all the medicines, herbs, non-prescription drugs, or dietary supplements you use. Also tell them if you smoke, drink alcohol, or use illegal drugs. Some items may  interact with your medicine. What should I watch for while using this medication? Your condition will be monitored carefully while you are receiving this medication. This medication may make you feel generally unwell. This is not uncommon as chemotherapy can affect healthy cells as well as cancer cells. Report any side effects. Continue your course of treatment even though you feel ill unless your care team tells you to stop. In some cases, you may be given additional medications to help with side effects. Follow all directions for their use. This medication may increase your risk of getting an infection. Call your care team for advice if you get a fever, chills, sore throat, or other symptoms of a cold or flu. Do not treat yourself. Try to avoid being around people who are sick. This medication may increase your risk to bruise or bleed. Call your care team if you notice any unusual bleeding. Be careful brushing or flossing your teeth or using a toothpick because you may get an infection or bleed more easily. If you have any dental work done, tell your dentist you are receiving this medication. Avoid taking medications that contain aspirin, acetaminophen, ibuprofen, naproxen, or ketoprofen unless instructed by your care team. These medications may hide a fever. Do not treat diarrhea with over the counter products. Contact your care team if you have diarrhea that lasts more than 2 days or if it is severe and watery. This medication can make you more sensitive to the sun. Keep out of the sun. If you cannot avoid being in the sun, wear protective clothing and sunscreen. Do not use sun lamps, tanning beds, or tanning booths. Talk to  your care team if you or your partner wish to become pregnant or think you might be pregnant. This medication can cause serious birth defects if taken during pregnancy and for 3 months after the last dose. A reliable form of contraception is recommended while taking this  medication and for 3 months after the last dose. Talk to your care team about effective forms of contraception. Do not father a child while taking this medication and for 3 months after the last dose. Use a condom while having sex during this time period. Do not breastfeed while taking this medication. This medication may cause infertility. Talk to your care team if you are concerned about your fertility. What side effects may I notice from receiving this medication? Side effects that you should report to your care team as soon as possible: Allergic reactions--skin rash, itching, hives, swelling of the face, lips, tongue, or throat Heart attack--pain or tightness in the chest, shoulders, arms, or jaw, nausea, shortness of breath, cold or clammy skin, feeling faint or lightheaded Heart failure--shortness of breath, swelling of the ankles, feet, or hands, sudden weight gain, unusual weakness or fatigue Heart rhythm changes--fast or irregular heartbeat, dizziness, feeling faint or lightheaded, chest pain, trouble breathing High ammonia level--unusual weakness or fatigue, confusion, loss of appetite, nausea, vomiting, seizures Infection--fever, chills, cough, sore throat, wounds that don't heal, pain or trouble when passing urine, general feeling of discomfort or being unwell Low red blood cell level--unusual weakness or fatigue, dizziness, headache, trouble breathing Pain, tingling, or numbness in the hands or feet, muscle weakness, change in vision, confusion or trouble speaking, loss of balance or coordination, trouble walking, seizures Redness, swelling, and blistering of the skin over hands and feet Severe or prolonged diarrhea Unusual bruising or bleeding Side effects that usually do not require medical attention (report to your care team if they continue or are bothersome): Dry skin Headache Increased tears Nausea Pain, redness, or swelling with sores inside the mouth or throat Sensitivity  to light Vomiting This list may not describe all possible side effects. Call your doctor for medical advice about side effects. You may report side effects to FDA at 1-800-FDA-1088. Where should I keep my medication? This medication is given in a hospital or clinic. It will not be stored at home. NOTE: This sheet is a summary. It may not cover all possible information. If you have questions about this medicine, talk to your doctor, pharmacist, or health care provider.  2024 Else[employer] Standard (2021-08-23 00:00:00)Leucovorin Injection What is this medication? LEUCOVORIN (loo koe VOR in) prevents side effects from certain medications, such as methotrexate. It works by increasing folate levels. This helps protect healthy cells in your body. It may also be used to treat anemia caused by low levels of folate. It can also be used with fluorouracil, a type of chemotherapy, to treat colorectal cancer. It works by increasing the effects of fluorouracil in the body. This medicine may be used for other purposes; ask your health care provider or pharmacist if you have questions. What should I tell my care team before I take this medication? They need to know if you have any of these conditions: Anemia from low levels of vitamin B12 in the blood An unusual or allergic reaction to leucovorin, folic acid, other medications, foods, dyes, or preservatives Pregnant or trying to get pregnant Breastfeeding How should I use this medication? This medication is injected into a vein or a muscle. It is given by your care team in  a hospital or clinic setting. Talk to your care team about the use of this medication in children. Special care may be needed. Overdosage: If you think you have taken too much of this medicine contact a poison control center or emergency room at once. NOTE: This medicine is only for you. Do not share this medicine with others. What if I miss a dose? Keep appointments for follow-up doses.  It is important not to miss your dose. Call your care team if you are unable to keep an appointment. What may interact with this medication? Capecitabine Fluorouracil Phenobarbital Phenytoin Primidone Trimethoprim;sulfamethoxazole This list may not describe all possible interactions. Give your health care provider a list of all the medicines, herbs, non-prescription drugs, or dietary supplements you use. Also tell them if you smoke, drink alcohol, or use illegal drugs. Some items may interact with your medicine. What should I watch for while using this medication? Your condition will be monitored carefully while you are receiving this medication. This medication may increase the side effects of 5-fluorouracil. Tell your care team if you have diarrhea or mouth sores that do not get better or that get worse. What side effects may I notice from receiving this medication? Side effects that you should report to your care team as soon as possible: Allergic reactions--skin rash, itching, hives, swelling of the face, lips, tongue, or throat This list may not describe all possible side effects. Call your doctor for medical advice about side effects. You may report side effects to FDA at 1-800-FDA-1088. Where should I keep my medication? This medication is given in a hospital or clinic. It will not be stored at home. NOTE: This sheet is a summary. It may not cover all possible information. If you have questions about this medicine, talk to your doctor, pharmacist, or health care provider.  2024 [employer]/Gold Standard (2021-09-20 00:00:00)Oxaliplatin Injection What is this medication? OXALIPLATIN (ox AL i PLA tin) treats colorectal cancer. It works by slowing down the growth of cancer cells. This medicine may be used for other purposes; ask your health care provider or pharmacist if you have questions. COMMON BRAND NAME(S): Eloxatin What should I tell my care team before I take this medication? They  need to know if you have any of these conditions: Heart disease History of irregular heartbeat or rhythm Liver disease Low blood cell levels (white cells, red cells, and platelets) Lung or breathing disease, such as asthma Take medications that treat or prevent blood clots Tingling of the fingers, toes, or other nerve disorder An unusual or allergic reaction to oxaliplatin, other medications, foods, dyes, or preservatives If you or your partner are pregnant or trying to get pregnant Breast-feeding How should I use this medication? This medication is injected into a vein. It is given by your care team in a hospital or clinic setting. Talk to your care team about the use of this medication in children. Special care may be needed. Overdosage: If you think you have taken too much of this medicine contact a poison control center or emergency room at once. NOTE: This medicine is only for you. Do not share this medicine with others. What if I miss a dose? Keep appointments for follow-up doses. It is important not to miss a dose. Call your care team if you are unable to keep an appointment. What may interact with this medication? Do not take this medication with any of the following: Cisapride Dronedarone Pimozide Thioridazine This medication may also interact with the following:  Aspirin and aspirin-like medications Certain medications that treat or prevent blood clots, such as warfarin, apixaban, dabigatran, and rivaroxaban Cisplatin Cyclosporine Diuretics Medications for infection, such as acyclovir, adefovir, amphotericin B, bacitracin, cidofovir, foscarnet, ganciclovir, gentamicin, pentamidine, vancomycin NSAIDs, medications for pain and inflammation, such as ibuprofen or naproxen Other medications that cause heart rhythm changes Pamidronate Zoledronic acid This list may not describe all possible interactions. Give your health care provider a list of all the medicines, herbs,  non-prescription drugs, or dietary supplements you use. Also tell them if you smoke, drink alcohol, or use illegal drugs. Some items may interact with your medicine. What should I watch for while using this medication? Your condition will be monitored carefully while you are receiving this medication. You may need blood work while taking this medication. This medication may make you feel generally unwell. This is not uncommon as chemotherapy can affect healthy cells as well as cancer cells. Report any side effects. Continue your course of treatment even though you feel ill unless your care team tells you to stop. This medication may increase your risk of getting an infection. Call your care team for advice if you get a fever, chills, sore throat, or other symptoms of a cold or flu. Do not treat yourself. Try to avoid being around people who are sick. Avoid taking medications that contain aspirin, acetaminophen, ibuprofen, naproxen, or ketoprofen unless instructed by your care team. These medications may hide a fever. Be careful brushing or flossing your teeth or using a toothpick because you may get an infection or bleed more easily. If you have any dental work done, tell your dentist you are receiving this medication. This medication can make you more sensitive to cold. Do not drink cold drinks or use ice. Cover exposed skin before coming in contact with cold temperatures or cold objects. When out in cold weather wear warm clothing and cover your mouth and nose to warm the air that goes into your lungs. Tell your care team if you get sensitive to the cold. Talk to your care team if you or your partner are pregnant or think either of you might be pregnant. This medication can cause serious birth defects if taken during pregnancy and for 9 months after the last dose. A negative pregnancy test is required before starting this medication. A reliable form of contraception is recommended while taking this  medication and for 9 months after the last dose. Talk to your care team about effective forms of contraception. Do not father a child while taking this medication and for 6 months after the last dose. Use a condom while having sex during this time period. Do not breastfeed while taking this medication and for 3 months after the last dose. This medication may cause infertility. Talk to your care team if you are concerned about your fertility. What side effects may I notice from receiving this medication? Side effects that you should report to your care team as soon as possible: Allergic reactions--skin rash, itching, hives, swelling of the face, lips, tongue, or throat Bleeding--bloody or black, tar-like stools, vomiting blood or brown material that looks like coffee grounds, red or dark brown urine, small red or purple spots on skin, unusual bruising or bleeding Dry cough, shortness of breath or trouble breathing Heart rhythm changes--fast or irregular heartbeat, dizziness, feeling faint or lightheaded, chest pain, trouble breathing Infection--fever, chills, cough, sore throat, wounds that don't heal, pain or trouble when passing urine, general feeling of discomfort or being unwell  Liver injury--right upper belly pain, loss of appetite, nausea, light-colored stool, dark yellow or brown urine, yellowing skin or eyes, unusual weakness or fatigue Low red blood cell level--unusual weakness or fatigue, dizziness, headache, trouble breathing Muscle injury--unusual weakness or fatigue, muscle pain, dark yellow or brown urine, decrease in amount of urine Pain, tingling, or numbness in the hands or feet Sudden and severe headache, confusion, change in vision, seizures, which may be signs of posterior reversible encephalopathy syndrome (PRES) Unusual bruising or bleeding Side effects that usually do not require medical attention (report to your care team if they continue or are  bothersome): Diarrhea Nausea Pain, redness, or swelling with sores inside the mouth or throat Unusual weakness or fatigue Vomiting This list may not describe all possible side effects. Call your doctor for medical advice about side effects. You may report side effects to FDA at 1-800-FDA-1088. Where should I keep my medication? This medication is given in a hospital or clinic. It will not be stored at home. NOTE: This sheet is a summary. It may not cover all possible information. If you have questions about this medicine, talk to your doctor, pharmacist, or health care provider.  2024 [employer]/Gold Standard (2023-03-30 00:00:00)

## 2024-01-15 ENCOUNTER — Other Ambulatory Visit: Payer: Self-pay | Admitting: Oncology

## 2024-01-15 ENCOUNTER — Telehealth: Payer: Self-pay

## 2024-01-16 ENCOUNTER — Other Ambulatory Visit: Payer: Self-pay

## 2024-01-16 ENCOUNTER — Inpatient Hospital Stay: Payer: Self-pay

## 2024-01-16 ENCOUNTER — Encounter: Payer: Self-pay | Admitting: Oncology

## 2024-01-16 DIAGNOSIS — F17210 Nicotine dependence, cigarettes, uncomplicated: Secondary | ICD-10-CM

## 2024-01-16 MED ORDER — NICOTINE 21 MG/24HR TD PT24: MEDICATED_PATCH | TRANSDERMAL | 0 refills | Status: DC

## 2024-01-16 NOTE — Telephone Encounter (Signed)
 I spoke with pt to see how he is doing. He replied, I guess I am doing as good as expected. I am having a lot of diarrhea. Pt asked if he is taking Imodium. He replied, Yes, probably not as much as I need to. Pt educated that he should take 2 Imodium with first diarrhea episode of the day, then 1 tab after each diarrhea episode thereafter up to 8 tabs/day. Pt describes as squirting a lot, just enough to soil my underwear, but not fill them. I've been about 15-20 times, but not enough to flush toilet each time. He is having some abd cramping and has noticed some bright red blood with wiping. Pt told can use hemorrhoidal wipes PRN. I encouraged pt to drink a cup of fluid for each time he has diarrhea to prevent dehydration. No issues with pump. Slept well. Has noticed being tired. I told him fatigue is the number one side effect of cancer treatment. He should eat protein rich diet, drinks plenty of fluids, stay as active as possible, listen to his body, and take rest periods/naps PRN. Has appt this evening @ 330p to remove pump.

## 2024-01-16 NOTE — Patient Instructions (Signed)
 Fluorouracil Injection What is this medication? FLUOROURACIL (flure oh YOOR a sil) treats some types of cancer. It works by slowing down the growth of cancer cells. This medicine may be used for other purposes; ask your health care provider or pharmacist if you have questions. COMMON BRAND NAME(S): Adrucil What should I tell my care team before I take this medication? They need to know if you have any of these conditions: Blood disorders Dihydropyrimidine dehydrogenase (DPD) deficiency Infection, such as chickenpox, cold sores, herpes Kidney disease Liver disease Poor nutrition Recent or ongoing radiation therapy An unusual or allergic reaction to fluorouracil, other medications, foods, dyes, or preservatives If you or your partner are pregnant or trying to get pregnant Breast-feeding How should I use this medication? This medication is injected into a vein. It is administered by your care team in a hospital or clinic setting. Talk to your care team about the use of this medication in children. Special care may be needed. Overdosage: If you think you have taken too much of this medicine contact a poison control center or emergency room at once. NOTE: This medicine is only for you. Do not share this medicine with others. What if I miss a dose? Keep appointments for follow-up doses. It is important not to miss your dose. Call your care team if you are unable to keep an appointment. What may interact with this medication? Do not take this medication with any of the following: Live virus vaccines This medication may also interact with the following: Medications that treat or prevent blood clots, such as warfarin, enoxaparin, dalteparin This list may not describe all possible interactions. Give your health care provider a list of all the medicines, herbs, non-prescription drugs, or dietary supplements you use. Also tell them if you smoke, drink alcohol, or use illegal drugs. Some items may  interact with your medicine. What should I watch for while using this medication? Your condition will be monitored carefully while you are receiving this medication. This medication may make you feel generally unwell. This is not uncommon as chemotherapy can affect healthy cells as well as cancer cells. Report any side effects. Continue your course of treatment even though you feel ill unless your care team tells you to stop. In some cases, you may be given additional medications to help with side effects. Follow all directions for their use. This medication may increase your risk of getting an infection. Call your care team for advice if you get a fever, chills, sore throat, or other symptoms of a cold or flu. Do not treat yourself. Try to avoid being around people who are sick. This medication may increase your risk to bruise or bleed. Call your care team if you notice any unusual bleeding. Be careful brushing or flossing your teeth or using a toothpick because you may get an infection or bleed more easily. If you have any dental work done, tell your dentist you are receiving this medication. Avoid taking medications that contain aspirin, acetaminophen, ibuprofen, naproxen, or ketoprofen unless instructed by your care team. These medications may hide a fever. Do not treat diarrhea with over the counter products. Contact your care team if you have diarrhea that lasts more than 2 days or if it is severe and watery. This medication can make you more sensitive to the sun. Keep out of the sun. If you cannot avoid being in the sun, wear protective clothing and sunscreen. Do not use sun lamps, tanning beds, or tanning booths. Talk to  your care team if you or your partner wish to become pregnant or think you might be pregnant. This medication can cause serious birth defects if taken during pregnancy and for 3 months after the last dose. A reliable form of contraception is recommended while taking this  medication and for 3 months after the last dose. Talk to your care team about effective forms of contraception. Do not father a child while taking this medication and for 3 months after the last dose. Use a condom while having sex during this time period. Do not breastfeed while taking this medication. This medication may cause infertility. Talk to your care team if you are concerned about your fertility. What side effects may I notice from receiving this medication? Side effects that you should report to your care team as soon as possible: Allergic reactions--skin rash, itching, hives, swelling of the face, lips, tongue, or throat Heart attack--pain or tightness in the chest, shoulders, arms, or jaw, nausea, shortness of breath, cold or clammy skin, feeling faint or lightheaded Heart failure--shortness of breath, swelling of the ankles, feet, or hands, sudden weight gain, unusual weakness or fatigue Heart rhythm changes--fast or irregular heartbeat, dizziness, feeling faint or lightheaded, chest pain, trouble breathing High ammonia level--unusual weakness or fatigue, confusion, loss of appetite, nausea, vomiting, seizures Infection--fever, chills, cough, sore throat, wounds that don't heal, pain or trouble when passing urine, general feeling of discomfort or being unwell Low red blood cell level--unusual weakness or fatigue, dizziness, headache, trouble breathing Pain, tingling, or numbness in the hands or feet, muscle weakness, change in vision, confusion or trouble speaking, loss of balance or coordination, trouble walking, seizures Redness, swelling, and blistering of the skin over hands and feet Severe or prolonged diarrhea Unusual bruising or bleeding Side effects that usually do not require medical attention (report to your care team if they continue or are bothersome): Dry skin Headache Increased tears Nausea Pain, redness, or swelling with sores inside the mouth or throat Sensitivity  to light Vomiting This list may not describe all possible side effects. Call your doctor for medical advice about side effects. You may report side effects to FDA at 1-800-FDA-1088. Where should I keep my medication? This medication is given in a hospital or clinic. It will not be stored at home. NOTE: This sheet is a summary. It may not cover all possible information. If you have questions about this medicine, talk to your doctor, pharmacist, or health care provider.  2024 [employer] Standard (2021-08-23 00:00:00)

## 2024-01-17 ENCOUNTER — Telehealth: Payer: Self-pay | Admitting: Dietician

## 2024-01-17 NOTE — Progress Notes (Signed)
 AT THE TIME OF PUMP DC ON 01/17/24  PT STATED THAT HIS DIARRHEA HAD SLOWED DOWN AFTER USING IMODIUM.  STATES IF DIARRHEA WORSENS HE WILL CALL US  FOR HELP.

## 2024-01-17 NOTE — Telephone Encounter (Signed)
 Patient screened with weight loss and diarrhea. First attempt to reach. Provided my cell# on voice mail to return call to set up a nutrition consult.  Micheline Craven, RDN, LDN Registered Dietitian, [facility] Part Time Remote (Usual office hours: Tuesday-Thursday) Cell: [phone]

## 2024-01-24 ENCOUNTER — Telehealth: Payer: Self-pay | Admitting: Dietician

## 2024-01-24 NOTE — Telephone Encounter (Signed)
 Patient screened with weight loss and diarrhea. Second attempt to reach. Provided my cell# on voice mail to return call to set up a nutrition consult.  Micheline Craven, RDN, LDN Registered Dietitian, [facility] Part Time Remote (Usual office hours: Tuesday-Thursday) Cell: [phone]

## 2024-01-27 NOTE — Progress Notes (Unsigned)
 [facility] [address] [place],  [place]  [address] [phone]  Clinic Day:  01/28/2024  Referring physician: Silver Lamar LABOR, MD   HISTORY OF PRESENT ILLNESS:  The patient is a 58 y.o. male who I recently began seeing for stage IVA (T3 N2a M1a) rectal cancer, which includes 1 metastatic liver lesion.  He is currently receiving FOLFOX chemotherapy with the hope that it will shrink both his rectal cancer and his metastatic liver lesion with the hopes that both could potentially be synchronously resected.  He comes in today to be evaluated before heading into his second cycle of FOLFOX chemotherapy.  The patient tolerated his first cycle of FOLFOX fairly well.  Although he still has frequent stools, they are more formed.  He is pleased as he has less rectal pain.  He also denies having any right upper quadrant abdominal pain.  Overall, he believes his clinical picture is better after just 1 cycle of FOLFOX chemotherapy.  PHYSICAL EXAM:  Blood pressure 122/69, pulse 63, temperature 98.7 F (37.1 C), temperature source Oral, resp. rate 16, height 5' 7 (1.702 m), weight 171 lb (77.6 kg), SpO2 100%. Wt Readings from Last 3 Encounters:  01/28/24 171 lb (77.6 kg)  01/10/24 175 lb 6.4 oz (79.6 kg)  12/19/23 168 lb 14.4 oz (76.6 kg)   Body mass index is 26.78 kg/m. Performance status (ECOG): 1 - Symptomatic but completely ambulatory Physical Exam Constitutional:      Appearance: Normal appearance. He is not ill-appearing.  HENT:     Mouth/Throat:     Mouth: Mucous membranes are moist.     Pharynx: Oropharynx is clear. No oropharyngeal exudate or posterior oropharyngeal erythema.  Cardiovascular:     Rate and Rhythm: Normal rate and regular rhythm.     Heart sounds: No murmur heard.    No friction rub. No gallop.  Pulmonary:     Effort: Pulmonary effort is normal. No respiratory distress.     Breath sounds: Normal breath sounds. No wheezing, rhonchi  or rales.  Abdominal:     General: Bowel sounds are normal. There is no distension.     Palpations: Abdomen is soft. There is no mass.     Tenderness: There is no abdominal tenderness.  Musculoskeletal:        General: No swelling.     Right lower leg: No edema.     Left lower leg: No edema.  Lymphadenopathy:     Cervical: No cervical adenopathy.     Upper Body:     Right upper body: No supraclavicular or axillary adenopathy.     Left upper body: No supraclavicular or axillary adenopathy.     Lower Body: No right inguinal adenopathy. No left inguinal adenopathy.  Skin:    General: Skin is warm.     Coloration: Skin is not jaundiced.     Findings: No lesion or rash.  Neurological:     General: No focal deficit present.     Mental Status: He is alert and oriented to person, place, and time. Mental status is at baseline.  Psychiatric:        Mood and Affect: Mood normal.        Behavior: Behavior normal.        Thought Content: Thought content normal.     LABS:      Latest Ref Rng & Units 01/28/2024    8:33 AM 01/10/2024    3:08 PM 12/19/2023  10:23 AM  CBC  WBC 4.0 - 10.5 K/uL 4.0  6.0  5.4   Hemoglobin 13.0 - 17.0 g/dL 86.1  86.1  84.7   Hematocrit 39.0 - 52.0 % 42.0  43.0  47.4   Platelets 150 - 400 K/uL 225  239  272       Latest Ref Rng & Units 01/28/2024    8:33 AM 01/10/2024    3:08 PM 12/19/2023   10:23 AM  CMP  Glucose 70 - 99 mg/dL 886  92  898   BUN 6 - 20 mg/dL 12  10  15    Creatinine 0.61 - 1.24 mg/dL 8.93  9.11  9.05   Sodium 135 - 145 mmol/L 142  141  140   Potassium 3.5 - 5.1 mmol/L 3.6  3.9  4.4   Chloride 98 - 111 mmol/L 104  105  104   CO2 22 - 32 mmol/L 25  26  24    Calcium  8.9 - 10.3 mg/dL 9.5  9.0  9.7   Total Protein 6.5 - 8.1 g/dL 6.9  6.5  7.3   Total Bilirubin 0.0 - 1.2 mg/dL 0.3  0.3  0.4   Alkaline Phos 38 - 126 U/L 81  62  56   AST 15 - 41 U/L 17  14  12    ALT 0 - 44 U/L 15  7  8     Lab Results  Component Value Date   CEA 6.9 (H)  11/27/2023   PATHOLOGY (11-27-23): FINAL DIAGNOSIS        1. Duodenum, Biopsy,  :       - DUODENAL MUCOSA WITH NO SPECIFIC HISTOPATHOLOGIC CHANGES       - NEGATIVE FOR INCREASED INTRAEPITHELIAL LYMPHOCYTES OR VILLOUS ARCHITECTURAL       CHANGES        2. 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 Ascending  Colon Polyp, x 1 :       - TUBULAR ADENOMA(S)       - NEGATIVE FOR HIGH-GRADE DYSPLASIA OR MALIGNANCY        4. Rectum, biopsy, circumferential apple core mass :       - INVASIVE MODERATELY DIFFERENTIATED ADENOCARCINOMA, SEE COMMENT    STUDIES: His PET scan from 12/24/2023 revealed the following:  FINDINGS: Mediastinal blood-pool activity (background): SUV max = 2.2  Liver activity (reference): SUV max = N/A  NECK: No hypermetabolic lymph nodes or masses.  Incidental CT findings: None.  CHEST: No hypermetabolic lymph nodes. No suspicious pulmonary nodules seen on CT images.  Incidental CT findings: None.  ABDOMEN/PELVIS: 5 cm low-attenuation mass near junction of right and left hepatic lobes shows peripheral hypermetabolism with central necrosis. This has SUV max of 9.6, consistent liver metastasis. No other hypermetabolic liver lesions are seen. No abnormal hypermetabolic activity within the pancreas, adrenal glands, or spleen.  Circumferential rectal mass is seen which shows hypermetabolism, with SUV max of 27.4, consistent with rectal carcinoma. Extramural vascular invasion is again seen in the left anterior perirectal region which is hypermetabolic.  Mildly hypermetabolic mesorectal lymph nodes are seen in the left lateral and posterior perirectal space and sigmoid mesocolon, with SUV max measuring up to 3.9. Largest lymph node measures 10 mm in the left perirectal space. These are consistent with local lymph node metastases. No hypermetabolic extra-mesorectal lymph nodes seen.  Incidental CT  findings: 11 mm nonobstructing left renal calculus.  SKELETON: No focal hypermetabolic bone lesions to suggest skeletal metastasis.  Incidental CT findings: None.  IMPRESSION: Hypermetabolic circumferential rectal mass, consistent with primary rectal carcinoma.  Local lymph node metastases in the mesorectal space and sigmoid mesocolon.  5 cm hypermetabolic metastasis in the left hepatic lobe.  No evidence of metastatic disease in the neck or chest.  11 mm nonobstructing left renal calculus.  ASSESSMENT & PLAN:  A 58 y.o. male with stage IVA (T3 N2a M1a) rectal cancer.  He will proceed with his second of 6 planned cycles of neoadjuvant FOLFOX chemotherapy today.  Clinically, he appears to be doing very well.  I will see him back in 2 weeks before he has into his third cycle of neoadjuvant FOLFOX chemotherapy. The patient understands all the plans discussed today and is in agreement with them.  Sadia Belfiore DELENA Kerns, MD

## 2024-01-28 ENCOUNTER — Inpatient Hospital Stay: Payer: Self-pay

## 2024-01-28 ENCOUNTER — Encounter: Payer: Self-pay | Admitting: Oncology

## 2024-01-28 ENCOUNTER — Inpatient Hospital Stay: Payer: Self-pay | Admitting: Oncology

## 2024-01-28 ENCOUNTER — Telehealth: Payer: Self-pay | Admitting: Oncology

## 2024-01-28 VITALS — BP 122/69 | HR 63 | Temp 98.7°F | Resp 16 | Ht 67.0 in | Wt 171.0 lb

## 2024-01-28 DIAGNOSIS — C787 Secondary malignant neoplasm of liver and intrahepatic bile duct: Secondary | ICD-10-CM

## 2024-01-28 DIAGNOSIS — C20 Malignant neoplasm of rectum: Secondary | ICD-10-CM

## 2024-01-28 LAB — CMP (CANCER CENTER ONLY)
ALT: 15 U/L (ref 0–44)
AST: 17 U/L (ref 15–41)
Albumin: 3.8 g/dL (ref 3.5–5.0)
Alkaline Phosphatase: 81 U/L (ref 38–126)
Anion gap: 13 (ref 5–15)
BUN: 12 mg/dL (ref 6–20)
CO2: 25 mmol/L (ref 22–32)
Calcium: 9.5 mg/dL (ref 8.9–10.3)
Chloride: 104 mmol/L (ref 98–111)
Creatinine: 1.06 mg/dL (ref 0.61–1.24)
GFR, Estimated: >60 mL/min (ref >60)
Glucose, Bld: 113 mg/dL — ABNORMAL HIGH (ref 70–99)
Potassium: 3.6 mmol/L (ref 3.5–5.1)
Sodium: 142 mmol/L (ref 135–145)
Total Bilirubin: 0.3 mg/dL (ref 0.0–1.2)
Total Protein: 6.9 g/dL (ref 6.5–8.1)

## 2024-01-28 LAB — CBC WITH DIFFERENTIAL (CANCER CENTER ONLY)
Abs Immature Granulocytes: 0.02 K/uL (ref 0.00–0.07)
Basophils Absolute: 0 K/uL (ref 0.0–0.1)
Basophils Relative: 1 %
Eosinophils Absolute: 0.1 K/uL (ref 0.0–0.5)
Eosinophils Relative: 3 %
HCT: 42 % (ref 39.0–52.0)
Hemoglobin: 13.8 g/dL (ref 13.0–17.0)
Immature Granulocytes: 1 %
Lymphocytes Relative: 38 %
Lymphs Abs: 1.5 K/uL (ref 0.7–4.0)
MCH: 28.4 pg (ref 26.0–34.0)
MCHC: 32.9 g/dL (ref 30.0–36.0)
MCV: 86.4 fL (ref 80.0–100.0)
Monocytes Absolute: 0.3 K/uL (ref 0.1–1.0)
Monocytes Relative: 8 %
Neutro Abs: 2 K/uL (ref 1.7–7.7)
Neutrophils Relative %: 49 %
Platelet Count: 225 K/uL (ref 150–400)
RBC: 4.86 MIL/uL (ref 4.22–5.81)
RDW: 15.9 % — ABNORMAL HIGH (ref 11.5–15.5)
WBC Count: 4 K/uL (ref 4.0–10.5)
nRBC: 0 % (ref 0.0–0.2)

## 2024-01-28 MED ORDER — SODIUM CHLORIDE 0.9% FLUSH: 10.0000 mL | INTRAVENOUS | Status: DC | PRN

## 2024-01-28 MED ORDER — LEUCOVORIN CALCIUM INJECTION 350 MG
400.0000 mg/m2 | Freq: Once | INTRAVENOUS | Status: AC
  Administered 2024-01-28: 760 mg via INTRAVENOUS
  Filled 2024-01-28: qty 38

## 2024-01-28 MED ORDER — SODIUM CHLORIDE 0.9 % IV SOLN
2400.0000 mg/m2 | INTRAVENOUS | Status: DC
  Administered 2024-01-28: 5000 mg via INTRAVENOUS
  Filled 2024-01-28: qty 100

## 2024-01-28 MED ORDER — FLUOROURACIL CHEMO INJECTION 2.5 GM/50ML
400.0000 mg/m2 | Freq: Once | INTRAVENOUS | Status: AC
  Administered 2024-01-28: 750 mg via INTRAVENOUS
  Filled 2024-01-28: qty 15

## 2024-01-28 MED ORDER — OXALIPLATIN CHEMO INJECTION 100 MG/20ML
87.0000 mg/m2 | Freq: Once | INTRAVENOUS | Status: AC
  Administered 2024-01-28: 165 mg via INTRAVENOUS
  Filled 2024-01-28 (×2): qty 33

## 2024-01-28 MED ORDER — DEXTROSE 5 % IV SOLN: INTRAVENOUS | Status: DC

## 2024-01-28 MED ORDER — PALONOSETRON HCL INJECTION 0.25 MG/5ML
0.2500 mg | Freq: Once | INTRAVENOUS | Status: AC
  Administered 2024-01-28: 0.25 mg via INTRAVENOUS
  Filled 2024-01-28: qty 5

## 2024-01-28 MED ORDER — DEXAMETHASONE SODIUM PHOSPHATE 10 MG/ML IJ SOLN
10.0000 mg | Freq: Once | INTRAMUSCULAR | Status: AC
  Administered 2024-01-28: 10 mg via INTRAVENOUS
  Filled 2024-01-28: qty 1

## 2024-01-28 NOTE — Patient Instructions (Signed)
 Leucovorin  Injection What is this medication? LEUCOVORIN  (loo koe VOR in) prevents side effects from certain medications, such as methotrexate. It works by increasing folate levels. This helps protect healthy cells in your body. It may also be used to treat anemia caused by low levels of folate. It can also be used with fluorouracil , a type of chemotherapy, to treat colorectal cancer. It works by increasing the effects of fluorouracil  in the body. This medicine may be used for other purposes; ask your health care provider or pharmacist if you have questions. What should I tell my care team before I take this medication? They need to know if you have any of these conditions: Anemia from low levels of vitamin B12 in the blood An unusual or allergic reaction to leucovorin , folic acid, other medications, foods, dyes, or preservatives Pregnant or trying to get pregnant Breastfeeding How should I use this medication? This medication is injected into a vein or a muscle. It is given by your care team in a hospital or clinic setting. Talk to your care team about the use of this medication in children. Special care may be needed. Overdosage: If you think you have taken too much of this medicine contact a poison control center or emergency room at once. NOTE: This medicine is only for you. Do not share this medicine with others. What if I miss a dose? Keep appointments for follow-up doses. It is important not to miss your dose. Call your care team if you are unable to keep an appointment. What may interact with this medication? Capecitabine Fluorouracil  Phenobarbital Phenytoin Primidone Trimethoprim;sulfamethoxazole This list may not describe all possible interactions. Give your health care provider a list of all the medicines, herbs, non-prescription drugs, or dietary supplements you use. Also tell them if you smoke, drink alcohol, or use illegal drugs. Some items may interact with your medicine. What  should I watch for while using this medication? Your condition will be monitored carefully while you are receiving this medication. This medication may increase the side effects of 5-fluorouracil . Tell your care team if you have diarrhea or mouth sores that do not get better or that get worse. What side effects may I notice from receiving this medication? Side effects that you should report to your care team as soon as possible: Allergic reactions--skin rash, itching, hives, swelling of the face, lips, tongue, or throat This list may not describe all possible side effects. Call your doctor for medical advice about side effects. You may report side effects to FDA at 1-800-FDA-1088. Where should I keep my medication? This medication is given in a hospital or clinic. It will not be stored at home. NOTE: This sheet is a summary. It may not cover all possible information. If you have questions about this medicine, talk to your doctor, pharmacist, or health care provider.  2024 Elsevier/Gold Standard (2021-09-20 00:00:00)Fluorouracil  Injection What is this medication? FLUOROURACIL  (flure oh YOOR a sil) treats some types of cancer. It works by slowing down the growth of cancer cells. This medicine may be used for other purposes; ask your health care provider or pharmacist if you have questions. COMMON BRAND NAME(S): Adrucil  What should I tell my care team before I take this medication? They need to know if you have any of these conditions: Blood disorders Dihydropyrimidine dehydrogenase (DPD) deficiency Infection, such as chickenpox, cold sores, herpes Kidney disease Liver disease Poor nutrition Recent or ongoing radiation therapy An unusual or allergic reaction to fluorouracil , other medications, foods, dyes, or  preservatives If you or your partner are pregnant or trying to get pregnant Breast-feeding How should I use this medication? This medication is injected into a vein. It is administered  by your care team in a hospital or clinic setting. Talk to your care team about the use of this medication in children. Special care may be needed. Overdosage: If you think you have taken too much of this medicine contact a poison control center or emergency room at once. NOTE: This medicine is only for you. Do not share this medicine with others. What if I miss a dose? Keep appointments for follow-up doses. It is important not to miss your dose. Call your care team if you are unable to keep an appointment. What may interact with this medication? Do not take this medication with any of the following: Live virus vaccines This medication may also interact with the following: Medications that treat or prevent blood clots, such as warfarin, enoxaparin, dalteparin This list may not describe all possible interactions. Give your health care provider a list of all the medicines, herbs, non-prescription drugs, or dietary supplements you use. Also tell them if you smoke, drink alcohol, or use illegal drugs. Some items may interact with your medicine. What should I watch for while using this medication? Your condition will be monitored carefully while you are receiving this medication. This medication may make you feel generally unwell. This is not uncommon as chemotherapy can affect healthy cells as well as cancer cells. Report any side effects. Continue your course of treatment even though you feel ill unless your care team tells you to stop. In some cases, you may be given additional medications to help with side effects. Follow all directions for their use. This medication may increase your risk of getting an infection. Call your care team for advice if you get a fever, chills, sore throat, or other symptoms of a cold or flu. Do not treat yourself. Try to avoid being around people who are sick. This medication may increase your risk to bruise or bleed. Call your care team if you notice any unusual  bleeding. Be careful brushing or flossing your teeth or using a toothpick because you may get an infection or bleed more easily. If you have any dental work done, tell your dentist you are receiving this medication. Avoid taking medications that contain aspirin, acetaminophen, ibuprofen, naproxen, or ketoprofen unless instructed by your care team. These medications may hide a fever. Do not treat diarrhea with over the counter products. Contact your care team if you have diarrhea that lasts more than 2 days or if it is severe and watery. This medication can make you more sensitive to the sun. Keep out of the sun. If you cannot avoid being in the sun, wear protective clothing and sunscreen. Do not use sun lamps, tanning beds, or tanning booths. Talk to your care team if you or your partner wish to become pregnant or think you might be pregnant. This medication can cause serious birth defects if taken during pregnancy and for 3 months after the last dose. A reliable form of contraception is recommended while taking this medication and for 3 months after the last dose. Talk to your care team about effective forms of contraception. Do not father a child while taking this medication and for 3 months after the last dose. Use a condom while having sex during this time period. Do not breastfeed while taking this medication. This medication may cause infertility. Talk to your  care team if you are concerned about your fertility. What side effects may I notice from receiving this medication? Side effects that you should report to your care team as soon as possible: Allergic reactions--skin rash, itching, hives, swelling of the face, lips, tongue, or throat Heart attack--pain or tightness in the chest, shoulders, arms, or jaw, nausea, shortness of breath, cold or clammy skin, feeling faint or lightheaded Heart failure--shortness of breath, swelling of the ankles, feet, or hands, sudden weight gain, unusual weakness  or fatigue Heart rhythm changes--fast or irregular heartbeat, dizziness, feeling faint or lightheaded, chest pain, trouble breathing High ammonia level--unusual weakness or fatigue, confusion, loss of appetite, nausea, vomiting, seizures Infection--fever, chills, cough, sore throat, wounds that don't heal, pain or trouble when passing urine, general feeling of discomfort or being unwell Low red blood cell level--unusual weakness or fatigue, dizziness, headache, trouble breathing Pain, tingling, or numbness in the hands or feet, muscle weakness, change in vision, confusion or trouble speaking, loss of balance or coordination, trouble walking, seizures Redness, swelling, and blistering of the skin over hands and feet Severe or prolonged diarrhea Unusual bruising or bleeding Side effects that usually do not require medical attention (report to your care team if they continue or are bothersome): Dry skin Headache Increased tears Nausea Pain, redness, or swelling with sores inside the mouth or throat Sensitivity to light Vomiting This list may not describe all possible side effects. Call your doctor for medical advice about side effects. You may report side effects to FDA at 1-800-FDA-1088. Where should I keep my medication? This medication is given in a hospital or clinic. It will not be stored at home. NOTE: This sheet is a summary. It may not cover all possible information. If you have questions about this medicine, talk to your doctor, pharmacist, or health care provider.  2024 Elsevier/Gold Standard (2021-08-23 00:00:00)Oxaliplatin  Injection What is this medication? OXALIPLATIN  (ox AL i PLA tin) treats colorectal cancer. It works by slowing down the growth of cancer cells. This medicine may be used for other purposes; ask your health care provider or pharmacist if you have questions. COMMON BRAND NAME(S): Eloxatin  What should I tell my care team before I take this medication? They need to  know if you have any of these conditions: Heart disease History of irregular heartbeat or rhythm Liver disease Low blood cell levels (white cells, red cells, and platelets) Lung or breathing disease, such as asthma Take medications that treat or prevent blood clots Tingling of the fingers, toes, or other nerve disorder An unusual or allergic reaction to oxaliplatin , other medications, foods, dyes, or preservatives If you or your partner are pregnant or trying to get pregnant Breast-feeding How should I use this medication? This medication is injected into a vein. It is given by your care team in a hospital or clinic setting. Talk to your care team about the use of this medication in children. Special care may be needed. Overdosage: If you think you have taken too much of this medicine contact a poison control center or emergency room at once. NOTE: This medicine is only for you. Do not share this medicine with others. What if I miss a dose? Keep appointments for follow-up doses. It is important not to miss a dose. Call your care team if you are unable to keep an appointment. What may interact with this medication? Do not take this medication with any of the following: Cisapride Dronedarone Pimozide Thioridazine This medication may also interact with the following:  Aspirin and aspirin-like medications Certain medications that treat or prevent blood clots, such as warfarin, apixaban, dabigatran, and rivaroxaban Cisplatin Cyclosporine Diuretics Medications for infection, such as acyclovir, adefovir, amphotericin B, bacitracin, cidofovir, foscarnet, ganciclovir, gentamicin, pentamidine, vancomycin NSAIDs, medications for pain and inflammation, such as ibuprofen or naproxen Other medications that cause heart rhythm changes Pamidronate Zoledronic acid This list may not describe all possible interactions. Give your health care provider a list of all the medicines, herbs, non-prescription  drugs, or dietary supplements you use. Also tell them if you smoke, drink alcohol, or use illegal drugs. Some items may interact with your medicine. What should I watch for while using this medication? Your condition will be monitored carefully while you are receiving this medication. You may need blood work while taking this medication. This medication may make you feel generally unwell. This is not uncommon as chemotherapy can affect healthy cells as well as cancer cells. Report any side effects. Continue your course of treatment even though you feel ill unless your care team tells you to stop. This medication may increase your risk of getting an infection. Call your care team for advice if you get a fever, chills, sore throat, or other symptoms of a cold or flu. Do not treat yourself. Try to avoid being around people who are sick. Avoid taking medications that contain aspirin, acetaminophen, ibuprofen, naproxen, or ketoprofen unless instructed by your care team. These medications may hide a fever. Be careful brushing or flossing your teeth or using a toothpick because you may get an infection or bleed more easily. If you have any dental work done, tell your dentist you are receiving this medication. This medication can make you more sensitive to cold. Do not drink cold drinks or use ice. Cover exposed skin before coming in contact with cold temperatures or cold objects. When out in cold weather wear warm clothing and cover your mouth and nose to warm the air that goes into your lungs. Tell your care team if you get sensitive to the cold. Talk to your care team if you or your partner are pregnant or think either of you might be pregnant. This medication can cause serious birth defects if taken during pregnancy and for 9 months after the last dose. A negative pregnancy test is required before starting this medication. A reliable form of contraception is recommended while taking this medication and for 9  months after the last dose. Talk to your care team about effective forms of contraception. Do not father a child while taking this medication and for 6 months after the last dose. Use a condom while having sex during this time period. Do not breastfeed while taking this medication and for 3 months after the last dose. This medication may cause infertility. Talk to your care team if you are concerned about your fertility. What side effects may I notice from receiving this medication? Side effects that you should report to your care team as soon as possible: Allergic reactions--skin rash, itching, hives, swelling of the face, lips, tongue, or throat Bleeding--bloody or black, tar-like stools, vomiting blood or brown material that looks like coffee grounds, red or dark brown urine, small red or purple spots on skin, unusual bruising or bleeding Dry cough, shortness of breath or trouble breathing Heart rhythm changes--fast or irregular heartbeat, dizziness, feeling faint or lightheaded, chest pain, trouble breathing Infection--fever, chills, cough, sore throat, wounds that don't heal, pain or trouble when passing urine, general feeling of discomfort or being unwell  Liver injury--right upper belly pain, loss of appetite, nausea, light-colored stool, dark yellow or brown urine, yellowing skin or eyes, unusual weakness or fatigue Low red blood cell level--unusual weakness or fatigue, dizziness, headache, trouble breathing Muscle injury--unusual weakness or fatigue, muscle pain, dark yellow or brown urine, decrease in amount of urine Pain, tingling, or numbness in the hands or feet Sudden and severe headache, confusion, change in vision, seizures, which may be signs of posterior reversible encephalopathy syndrome (PRES) Unusual bruising or bleeding Side effects that usually do not require medical attention (report to your care team if they continue or are bothersome): Diarrhea Nausea Pain, redness, or  swelling with sores inside the mouth or throat Unusual weakness or fatigue Vomiting This list may not describe all possible side effects. Call your doctor for medical advice about side effects. You may report side effects to FDA at 1-800-FDA-1088. Where should I keep my medication? This medication is given in a hospital or clinic. It will not be stored at home. NOTE: This sheet is a summary. It may not cover all possible information. If you have questions about this medicine, talk to your doctor, pharmacist, or health care provider.  2024 Elsevier/Gold Standard (2023-03-30 00:00:00)

## 2024-01-28 NOTE — Telephone Encounter (Signed)
 Patient has been scheduled for follow-up visit per 01/28/24 LOS.  Pt given an appt calendar with date and time.

## 2024-01-29 ENCOUNTER — Other Ambulatory Visit: Payer: Self-pay

## 2024-01-30 ENCOUNTER — Inpatient Hospital Stay: Payer: Self-pay | Attending: Oncology

## 2024-01-30 DIAGNOSIS — C787 Secondary malignant neoplasm of liver and intrahepatic bile duct: Secondary | ICD-10-CM | POA: Insufficient documentation

## 2024-01-30 DIAGNOSIS — Z5111 Encounter for antineoplastic chemotherapy: Secondary | ICD-10-CM | POA: Insufficient documentation

## 2024-01-30 DIAGNOSIS — C20 Malignant neoplasm of rectum: Secondary | ICD-10-CM | POA: Insufficient documentation

## 2024-01-30 NOTE — Patient Instructions (Signed)
 [facility] - A DEPT OF [place]  Discharge Instructions: Thank you for choosing [facility] to provide your oncology and hematology care.  If you have a lab appointment with the Cancer Center, please go directly to the Cancer Center and check in at the registration area.   Wear comfortable clothing and clothing appropriate for easy access to any Portacath or PICC line.   We strive to give you quality time with your provider. You may need to reschedule your appointment if you arrive late (15 or more minutes).  Arriving late affects you and other patients whose appointments are after yours.  Also, if you miss three or more appointments without notifying the office, you may be dismissed from the clinic at the provider's discretion.      For prescription refill requests, have your pharmacy contact our office and allow 72 hours for refills to be completed.    Today you received the following chemotherapy and/or immunotherapy agents    To help prevent nausea and vomiting after your treatment, we encourage you to take your nausea medication as directed.  BELOW ARE SYMPTOMS THAT SHOULD BE REPORTED IMMEDIATELY: *FEVER GREATER THAN 100.4 F (38 C) OR HIGHER *CHILLS OR SWEATING *NAUSEA AND VOMITING THAT IS NOT CONTROLLED WITH YOUR NAUSEA MEDICATION *UNUSUAL SHORTNESS OF BREATH *UNUSUAL BRUISING OR BLEEDING *URINARY PROBLEMS (pain or burning when urinating, or frequent urination) *BOWEL PROBLEMS (unusual diarrhea, constipation, pain near the anus) TENDERNESS IN MOUTH AND THROAT WITH OR WITHOUT PRESENCE OF ULCERS (sore throat, sores in mouth, or a toothache) UNUSUAL RASH, SWELLING OR PAIN  UNUSUAL VAGINAL DISCHARGE OR ITCHING   Items with * indicate a potential emergency and should be followed up as soon as possible or go to the Emergency Department if any problems should occur.  Please show the CHEMOTHERAPY ALERT CARD or IMMUNOTHERAPY ALERT CARD at  check-in to the Emergency Department and triage nurse.  Should you have questions after your visit or need to cancel or reschedule your appointment, please contact [facility]  - A DEPT OF [facility]  Dept: [phone]  and follow the prompts.  Office hours are 8:00 a.m. to 4:30 p.m. Monday - Friday. Please note that voicemails left after 4:00 p.m. may not be returned until the following business day.  We are closed weekends and major holidays. You have access to a nurse at all times for urgent questions. Please call the main number to the clinic Dept: [phone] and follow the prompts.  For any non-urgent questions, you may also contact your provider using MyChart. We now offer e-Visits for anyone 55 and older to request care online for non-urgent symptoms. For details visit [email or web address].   Also download the MyChart app! Go to the app store, search MyChart, open the app, select [facility], and log in with your MyChart username and password.

## 2024-02-05 ENCOUNTER — Telehealth: Payer: Self-pay

## 2024-02-05 NOTE — Telephone Encounter (Addendum)
 Pt called to get some advice regarding his bowel movements. He states, I've been going to the bathroom every 10-15 minutes. It is small amounts, and having bad cramps, like doubling over pain since Thursday. His last treatment was on Wednesday. Pt has only taken stool softeners. Pt given the following instructions and I gave him time to write all information down. First he needs to purchase the following: senna -S, magnesium of citrate, oil retention enema, and saline enema. Pt to take 1 bottle of magnesium citrate when available. If no results in couple hours, he can take another 1/2 bottle of magnesium citrate. He will start the senna-S 2 tabs po BID everyday. Pt will call me if no response with mag citrate. I told him hopefully we wouldn't have to use the enemas, but he needs to have them in the home. Pt replied, Carola will have to put me asleep to give the enema. I'm serious. I replied, we don't put people to sleep to give enemas. I asked if he lived alone. He replied, No, my wife is here. Pt encouraged to try using heating pad & tylenol for cramping. Rest on left side when possible.I will await his return call.

## 2024-02-10 NOTE — Progress Notes (Unsigned)
 [facility] [address] [place],  [place]  [address] [phone]  Clinic Day:  02/11/2024  Referring physician: Silver Lamar LABOR, MD   HISTORY OF PRESENT ILLNESS:  The patient is a 58 y.o. male with stage IVA (T3 N2a M1a) rectal cancer, which includes 1 metastatic liver lesion.  He is currently receiving FOLFOX chemotherapy with the hope that it will shrink both his rectal cancer and his metastatic liver lesion to where both could be synchronously resected.  He comes in today to be evaluated before heading into his 3rd cycle of FOLFOX chemotherapy.  The patient tolerated his 2nd cycle of FOLFOX fairly well.  However, he has begun to have more constipation.  In the past few years, diarrhea has been a major issue.  Otherwise, he is doing well and is willing to proceed with his next cycle of treatment.  PHYSICAL EXAM:  Blood pressure 106/65, pulse 68, temperature 99 F (37.2 C), temperature source Oral, resp. rate 16, height 5' 7 (1.702 m), weight 170 lb 4.8 oz (77.2 kg), SpO2 99%. Wt Readings from Last 3 Encounters:  02/11/24 170 lb 4.8 oz (77.2 kg)  01/28/24 171 lb (77.6 kg)  01/10/24 175 lb 6.4 oz (79.6 kg)   Body mass index is 26.67 kg/m. Performance status (ECOG): 1 - Symptomatic but completely ambulatory Physical Exam Constitutional:      Appearance: Normal appearance. He is not ill-appearing.  HENT:     Mouth/Throat:     Mouth: Mucous membranes are moist.     Pharynx: Oropharynx is clear. No oropharyngeal exudate or posterior oropharyngeal erythema.  Cardiovascular:     Rate and Rhythm: Normal rate and regular rhythm.     Heart sounds: No murmur heard.    No friction rub. No gallop.  Pulmonary:     Effort: Pulmonary effort is normal. No respiratory distress.     Breath sounds: Normal breath sounds. No wheezing, rhonchi or rales.  Abdominal:     General: Bowel sounds are normal. There is no distension.     Palpations: Abdomen is soft.  There is no mass.     Tenderness: There is no abdominal tenderness.  Musculoskeletal:        General: No swelling.     Right lower leg: No edema.     Left lower leg: No edema.  Lymphadenopathy:     Cervical: No cervical adenopathy.     Upper Body:     Right upper body: No supraclavicular or axillary adenopathy.     Left upper body: No supraclavicular or axillary adenopathy.     Lower Body: No right inguinal adenopathy. No left inguinal adenopathy.  Skin:    General: Skin is warm.     Coloration: Skin is not jaundiced.     Findings: No lesion or rash.  Neurological:     General: No focal deficit present.     Mental Status: He is alert and oriented to person, place, and time. Mental status is at baseline.  Psychiatric:        Mood and Affect: Mood normal.        Behavior: Behavior normal.        Thought Content: Thought content normal.     LABS:      Latest Ref Rng & Units 02/11/2024    8:48 AM 01/28/2024    8:33 AM 01/10/2024    3:08 PM  CBC  WBC 4.0 - 10.5 K/uL 4.0  4.0  6.0  Hemoglobin 13.0 - 17.0 g/dL 85.9  86.1  86.1   Hematocrit 39.0 - 52.0 % 41.9  42.0  43.0   Platelets 150 - 400 K/uL 148  225  239       Latest Ref Rng & Units 02/11/2024    8:48 AM 01/28/2024    8:33 AM 01/10/2024    3:08 PM  CMP  Glucose 70 - 99 mg/dL 93  886  92   BUN 6 - 20 mg/dL 14  12  10    Creatinine 0.61 - 1.24 mg/dL 9.03  8.93  9.11   Sodium 135 - 145 mmol/L 142  142  141   Potassium 3.5 - 5.1 mmol/L 4.0  3.6  3.9   Chloride 98 - 111 mmol/L 105  104  105   CO2 22 - 32 mmol/L 25  25  26    Calcium  8.9 - 10.3 mg/dL 9.3  9.5  9.0   Total Protein 6.5 - 8.1 g/dL 6.8  6.9  6.5   Total Bilirubin 0.0 - 1.2 mg/dL 0.3  0.3  0.3   Alkaline Phos 38 - 126 U/L 91  81  62   AST 15 - 41 U/L 50  17  14   ALT 0 - 44 U/L 98  15  7    Lab Results  Component Value Date   CEA 6.9 (H) 11/27/2023   PATHOLOGY (11-27-23): FINAL DIAGNOSIS        1. Duodenum, Biopsy,  :       - DUODENAL MUCOSA WITH NO  SPECIFIC HISTOPATHOLOGIC CHANGES       - NEGATIVE FOR INCREASED INTRAEPITHELIAL LYMPHOCYTES OR VILLOUS ARCHITECTURAL       CHANGES        2. 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 Ascending  Colon Polyp, x 1 :       - TUBULAR ADENOMA(S)       - NEGATIVE FOR HIGH-GRADE DYSPLASIA OR MALIGNANCY        4. Rectum, biopsy, circumferential apple core mass :       - INVASIVE MODERATELY DIFFERENTIATED ADENOCARCINOMA, SEE COMMENT    STUDIES: His PET scan from 12/24/2023 revealed the following:  FINDINGS: Mediastinal blood-pool activity (background): SUV max = 2.2  Liver activity (reference): SUV max = N/A  NECK: No hypermetabolic lymph nodes or masses.  Incidental CT findings: None.  CHEST: No hypermetabolic lymph nodes. No suspicious pulmonary nodules seen on CT images.  Incidental CT findings: None.  ABDOMEN/PELVIS: 5 cm low-attenuation mass near junction of right and left hepatic lobes shows peripheral hypermetabolism with central necrosis. This has SUV max of 9.6, consistent liver metastasis. No other hypermetabolic liver lesions are seen. No abnormal hypermetabolic activity within the pancreas, adrenal glands, or spleen.  Circumferential rectal mass is seen which shows hypermetabolism, with SUV max of 27.4, consistent with rectal carcinoma. Extramural vascular invasion is again seen in the left anterior perirectal region which is hypermetabolic.  Mildly hypermetabolic mesorectal lymph nodes are seen in the left lateral and posterior perirectal space and sigmoid mesocolon, with SUV max measuring up to 3.9. Largest lymph node measures 10 mm in the left perirectal space. These are consistent with local lymph node metastases. No hypermetabolic extra-mesorectal lymph nodes seen.  Incidental CT findings: 11 mm nonobstructing left renal calculus.  SKELETON: No focal hypermetabolic bone lesions to suggest  skeletal metastasis.  Incidental CT findings: None.  IMPRESSION: Hypermetabolic circumferential rectal mass, consistent with primary rectal carcinoma.  Local lymph node metastases in the mesorectal space and sigmoid mesocolon.  5 cm hypermetabolic metastasis in the left hepatic lobe.  No evidence of metastatic disease in the neck or chest.  11 mm nonobstructing left renal calculus.  ASSESSMENT & PLAN:  A 58 y.o. male with stage IVA (T3 N2a M1a) rectal cancer.  He will proceed with his 3rd of 6 planned cycles of neoadjuvant FOLFOX chemotherapy today.  With respect to his constipation, the patient was told to use either magnesium citrate or milk of magnesia to elicit bowel movements.  He also knows to use standard laxatives on a daily basis to further promote more regular bowel movements.  Otherwise, he appears to be doing very well.  I will see him back in 2 weeks before he heads into his 4th cycle of neoadjuvant FOLFOX chemotherapy. The patient understands all the plans discussed today and is in agreement with them.  Gratia Disla DELENA Kerns, MD

## 2024-02-11 ENCOUNTER — Other Ambulatory Visit: Payer: Self-pay

## 2024-02-11 ENCOUNTER — Inpatient Hospital Stay: Payer: Self-pay | Admitting: Oncology

## 2024-02-11 ENCOUNTER — Inpatient Hospital Stay: Payer: Self-pay

## 2024-02-11 ENCOUNTER — Telehealth: Payer: Self-pay | Admitting: Oncology

## 2024-02-11 ENCOUNTER — Other Ambulatory Visit: Payer: Self-pay | Admitting: Oncology

## 2024-02-11 ENCOUNTER — Other Ambulatory Visit: Payer: Self-pay | Admitting: Pharmacist

## 2024-02-11 ENCOUNTER — Encounter: Payer: Self-pay | Admitting: Oncology

## 2024-02-11 DIAGNOSIS — R5383 Other fatigue: Secondary | ICD-10-CM

## 2024-02-11 DIAGNOSIS — C787 Secondary malignant neoplasm of liver and intrahepatic bile duct: Secondary | ICD-10-CM

## 2024-02-11 DIAGNOSIS — C20 Malignant neoplasm of rectum: Secondary | ICD-10-CM

## 2024-02-11 LAB — CBC WITH DIFFERENTIAL (CANCER CENTER ONLY)
Abs Immature Granulocytes: 0.01 K/uL (ref 0.00–0.07)
Basophils Absolute: 0 K/uL (ref 0.0–0.1)
Basophils Relative: 1 %
Eosinophils Absolute: 0.1 K/uL (ref 0.0–0.5)
Eosinophils Relative: 1 %
HCT: 41.9 % (ref 39.0–52.0)
Hemoglobin: 14 g/dL (ref 13.0–17.0)
Immature Granulocytes: 0 %
Lymphocytes Relative: 35 %
Lymphs Abs: 1.4 K/uL (ref 0.7–4.0)
MCH: 29.1 pg (ref 26.0–34.0)
MCHC: 33.4 g/dL (ref 30.0–36.0)
MCV: 87.1 fL (ref 80.0–100.0)
Monocytes Absolute: 0.4 K/uL (ref 0.1–1.0)
Monocytes Relative: 10 %
Neutro Abs: 2.1 K/uL (ref 1.7–7.7)
Neutrophils Relative %: 53 %
Platelet Count: 148 K/uL — ABNORMAL LOW (ref 150–400)
RBC: 4.81 MIL/uL (ref 4.22–5.81)
RDW: 16.1 % — ABNORMAL HIGH (ref 11.5–15.5)
WBC Count: 4 K/uL (ref 4.0–10.5)
nRBC: 0 % (ref 0.0–0.2)

## 2024-02-11 LAB — CMP (CANCER CENTER ONLY)
ALT: 98 U/L — ABNORMAL HIGH (ref 0–44)
AST: 50 U/L — ABNORMAL HIGH (ref 15–41)
Albumin: 3.9 g/dL (ref 3.5–5.0)
Alkaline Phosphatase: 91 U/L (ref 38–126)
Anion gap: 11 (ref 5–15)
BUN: 14 mg/dL (ref 6–20)
CO2: 25 mmol/L (ref 22–32)
Calcium: 9.3 mg/dL (ref 8.9–10.3)
Chloride: 105 mmol/L (ref 98–111)
Creatinine: 0.96 mg/dL (ref 0.61–1.24)
GFR, Estimated: >60 mL/min (ref >60)
Glucose, Bld: 93 mg/dL (ref 70–99)
Potassium: 4 mmol/L (ref 3.5–5.1)
Sodium: 142 mmol/L (ref 135–145)
Total Bilirubin: 0.3 mg/dL (ref 0.0–1.2)
Total Protein: 6.8 g/dL (ref 6.5–8.1)

## 2024-02-11 LAB — IRON AND TIBC
Iron: 82 ug/dL (ref 45–182)
Saturation Ratios: 21 % (ref 17.9–39.5)
TIBC: 392 ug/dL (ref 250–450)
UIBC: 310 ug/dL

## 2024-02-11 LAB — VITAMIN B12: Vitamin B-12: 500 pg/mL (ref 180–914)

## 2024-02-11 LAB — CEA (ACCESS): CEA (CHCC): 32.92 ng/mL — ABNORMAL HIGH (ref 0.00–5.00)

## 2024-02-11 LAB — FOLATE: Folate: >20 ng/mL (ref >5.9)

## 2024-02-11 LAB — MAGNESIUM: Magnesium: 2.1 mg/dL (ref 1.7–2.4)

## 2024-02-11 LAB — FERRITIN: Ferritin: 104 ng/mL (ref 24–336)

## 2024-02-11 MED ORDER — DEXTROSE 5 % IV SOLN: INTRAVENOUS | Status: DC

## 2024-02-11 MED ORDER — SODIUM CHLORIDE 0.9% FLUSH: 10.0000 mL | INTRAVENOUS | Status: DC | PRN

## 2024-02-11 MED ORDER — DEXAMETHASONE SOD PHOSPHATE PF 10 MG/ML IJ SOLN
10.0000 mg | Freq: Once | INTRAMUSCULAR | Status: AC
  Administered 2024-02-11: 10 mg via INTRAVENOUS

## 2024-02-11 MED ORDER — FLUOROURACIL CHEMO INJECTION 2.5 GM/50ML
400.0000 mg/m2 | Freq: Once | INTRAVENOUS | Status: AC
  Administered 2024-02-11: 750 mg via INTRAVENOUS
  Filled 2024-02-11: qty 15

## 2024-02-11 MED ORDER — PALONOSETRON HCL INJECTION 0.25 MG/5ML
0.2500 mg | Freq: Once | INTRAVENOUS | Status: AC
  Administered 2024-02-11: 0.25 mg via INTRAVENOUS
  Filled 2024-02-11: qty 5

## 2024-02-11 MED ORDER — OXALIPLATIN CHEMO INJECTION 100 MG/20ML
87.0000 mg/m2 | Freq: Once | INTRAVENOUS | Status: AC
  Administered 2024-02-11: 165 mg via INTRAVENOUS
  Filled 2024-02-11: qty 33

## 2024-02-11 MED ORDER — SODIUM CHLORIDE 0.9 % IV SOLN
2400.0000 mg/m2 | INTRAVENOUS | Status: DC
  Administered 2024-02-11: 5000 mg via INTRAVENOUS
  Filled 2024-02-11: qty 100

## 2024-02-11 MED ORDER — LORAZEPAM 2 MG/ML IJ SOLN: 1.0000 mg | Freq: Every day | INTRAMUSCULAR | Status: DC | PRN

## 2024-02-11 MED ORDER — LEUCOVORIN CALCIUM INJECTION 350 MG
400.0000 mg/m2 | Freq: Once | INTRAVENOUS | Status: AC
  Administered 2024-02-11: 760 mg via INTRAVENOUS
  Filled 2024-02-11: qty 38

## 2024-02-11 NOTE — Patient Instructions (Signed)
 The chemotherapy medication bag should finish at 46 hours,. For example, if your pump is scheduled for 46 hours and it was put on at 4:00 p.m., it should finish at 2:00 p.m. the day it is scheduled to come off regardless of your appointment time.     Estimated time to finish at 1200Leucovorin Injection What is this medication? LEUCOVORIN  (loo koe VOR in) prevents side effects from certain medications, such as methotrexate. It works by increasing folate levels. This helps protect healthy cells in your body. It may also be used to treat anemia caused by low levels of folate. It can also be used with fluorouracil , a type of chemotherapy, to treat colorectal cancer. It works by increasing the effects of fluorouracil  in the body. This medicine may be used for other purposes; ask your health care provider or pharmacist if you have questions. What should I tell my care team before I take this medication? They need to know if you have any of these conditions: Anemia from low levels of vitamin B12 in the blood An unusual or allergic reaction to leucovorin , folic acid, other medications, foods, dyes, or preservatives Pregnant or trying to get pregnant Breastfeeding How should I use this medication? This medication is injected into a vein or a muscle. It is given by your care team in a hospital or clinic setting. Talk to your care team about the use of this medication in children. Special care may be needed. Overdosage: If you think you have taken too much of this medicine contact a poison control center or emergency room at once. NOTE: This medicine is only for you. Do not share this medicine with others. What if I miss a dose? Keep appointments for follow-up doses. It is important not to miss your dose. Call your care team if you are unable to keep an appointment. What may interact with this medication? Capecitabine Fluorouracil  Phenobarbital Phenytoin Primidone Trimethoprim;sulfamethoxazole This  list may not describe all possible interactions. Give your health care provider a list of all the medicines, herbs, non-prescription drugs, or dietary supplements you use. Also tell them if you smoke, drink alcohol, or use illegal drugs. Some items may interact with your medicine. What should I watch for while using this medication? Your condition will be monitored carefully while you are receiving this medication. This medication may increase the side effects of 5-fluorouracil . Tell your care team if you have diarrhea or mouth sores that do not get better or that get worse. What side effects may I notice from receiving this medication? Side effects that you should report to your care team as soon as possible: Allergic reactions--skin rash, itching, hives, swelling of the face, lips, tongue, or throat This list may not describe all possible side effects. Call your doctor for medical advice about side effects. You may report side effects to FDA at 1-800-FDA-1088. Where should I keep my medication? This medication is given in a hospital or clinic. It will not be stored at home. NOTE: This sheet is a summary. It may not cover all possible information. If you have questions about this medicine, talk to your doctor, pharmacist, or health care provider.  2024 Elsevier/Gold Standard (2021-09-20 00:00:00)Oxaliplatin  Injection What is this medication? OXALIPLATIN  (ox AL i PLA tin) treats colorectal cancer. It works by slowing down the growth of cancer cells. This medicine may be used for other purposes; ask your health care provider or pharmacist if you have questions. COMMON BRAND NAME(S): Eloxatin  What should I tell my care  team before I take this medication? They need to know if you have any of these conditions: Heart disease History of irregular heartbeat or rhythm Liver disease Low blood cell levels (white cells, red cells, and platelets) Lung or breathing disease, such as asthma Take medications  that treat or prevent blood clots Tingling of the fingers, toes, or other nerve disorder An unusual or allergic reaction to oxaliplatin , other medications, foods, dyes, or preservatives If you or your partner are pregnant or trying to get pregnant Breast-feeding How should I use this medication? This medication is injected into a vein. It is given by your care team in a hospital or clinic setting. Talk to your care team about the use of this medication in children. Special care may be needed. Overdosage: If you think you have taken too much of this medicine contact a poison control center or emergency room at once. NOTE: This medicine is only for you. Do not share this medicine with others. What if I miss a dose? Keep appointments for follow-up doses. It is important not to miss a dose. Call your care team if you are unable to keep an appointment. What may interact with this medication? Do not take this medication with any of the following: Cisapride Dronedarone Pimozide Thioridazine This medication may also interact with the following: Aspirin and aspirin-like medications Certain medications that treat or prevent blood clots, such as warfarin, apixaban, dabigatran, and rivaroxaban Cisplatin Cyclosporine Diuretics Medications for infection, such as acyclovir, adefovir, amphotericin B, bacitracin, cidofovir, foscarnet, ganciclovir, gentamicin, pentamidine, vancomycin NSAIDs, medications for pain and inflammation, such as ibuprofen or naproxen Other medications that cause heart rhythm changes Pamidronate Zoledronic acid This list may not describe all possible interactions. Give your health care provider a list of all the medicines, herbs, non-prescription drugs, or dietary supplements you use. Also tell them if you smoke, drink alcohol, or use illegal drugs. Some items may interact with your medicine. What should I watch for while using this medication? Your condition will be monitored  carefully while you are receiving this medication. You may need blood work while taking this medication. This medication may make you feel generally unwell. This is not uncommon as chemotherapy can affect healthy cells as well as cancer cells. Report any side effects. Continue your course of treatment even though you feel ill unless your care team tells you to stop. This medication may increase your risk of getting an infection. Call your care team for advice if you get a fever, chills, sore throat, or other symptoms of a cold or flu. Do not treat yourself. Try to avoid being around people who are sick. Avoid taking medications that contain aspirin, acetaminophen, ibuprofen, naproxen, or ketoprofen unless instructed by your care team. These medications may hide a fever. Be careful brushing or flossing your teeth or using a toothpick because you may get an infection or bleed more easily. If you have any dental work done, tell your dentist you are receiving this medication. This medication can make you more sensitive to cold. Do not drink cold drinks or use ice. Cover exposed skin before coming in contact with cold temperatures or cold objects. When out in cold weather wear warm clothing and cover your mouth and nose to warm the air that goes into your lungs. Tell your care team if you get sensitive to the cold. Talk to your care team if you or your partner are pregnant or think either of you might be pregnant. This medication can cause  serious birth defects if taken during pregnancy and for 9 months after the last dose. A negative pregnancy test is required before starting this medication. A reliable form of contraception is recommended while taking this medication and for 9 months after the last dose. Talk to your care team about effective forms of contraception. Do not father a child while taking this medication and for 6 months after the last dose. Use a condom while having sex during this time period. Do  not breastfeed while taking this medication and for 3 months after the last dose. This medication may cause infertility. Talk to your care team if you are concerned about your fertility. What side effects may I notice from receiving this medication? Side effects that you should report to your care team as soon as possible: Allergic reactions--skin rash, itching, hives, swelling of the face, lips, tongue, or throat Bleeding--bloody or black, tar-like stools, vomiting blood or brown material that looks like coffee grounds, red or dark brown urine, small red or purple spots on skin, unusual bruising or bleeding Dry cough, shortness of breath or trouble breathing Heart rhythm changes--fast or irregular heartbeat, dizziness, feeling faint or lightheaded, chest pain, trouble breathing Infection--fever, chills, cough, sore throat, wounds that don't heal, pain or trouble when passing urine, general feeling of discomfort or being unwell Liver injury--right upper belly pain, loss of appetite, nausea, light-colored stool, dark yellow or brown urine, yellowing skin or eyes, unusual weakness or fatigue Low red blood cell level--unusual weakness or fatigue, dizziness, headache, trouble breathing Muscle injury--unusual weakness or fatigue, muscle pain, dark yellow or brown urine, decrease in amount of urine Pain, tingling, or numbness in the hands or feet Sudden and severe headache, confusion, change in vision, seizures, which may be signs of posterior reversible encephalopathy syndrome (PRES) Unusual bruising or bleeding Side effects that usually do not require medical attention (report to your care team if they continue or are bothersome): Diarrhea Nausea Pain, redness, or swelling with sores inside the mouth or throat Unusual weakness or fatigue Vomiting This list may not describe all possible side effects. Call your doctor for medical advice about side effects. You may report side effects to FDA at  1-800-FDA-1088. Where should I keep my medication? This medication is given in a hospital or clinic. It will not be stored at home. NOTE: This sheet is a summary. It may not cover all possible information. If you have questions about this medicine, talk to your doctor, pharmacist, or health care provider.  2024 Elsevier/Gold Standard (2023-03-30 00:00:00).   If the display on your pump reads Low Volume and it is beeping, take the batteries out of the pump and come to the cancer center for it to be taken off.   If the pump alarms go off prior to the pump reading Low Volume then call [phone] and someone can assist you.  If the plunger comes out and the chemotherapy medication is leaking out, please use your home chemo spill kit to clean up the spill. Do NOT use paper towels or other household products.  If you have problems or questions regarding your pump, please call either [phone] (24 hours a day) or the cancer center Monday-Friday 8:00 a.m.- 4:30 p.m. at the clinic number and we will assist you. If you are unable to get assistance, then go to the nearest Emergency Department and ask the staff to contact the IV team for assistance.

## 2024-02-11 NOTE — Progress Notes (Signed)
 Per Dr. Ezzard ok to treat to with ALT 98.

## 2024-02-11 NOTE — Telephone Encounter (Signed)
 Patient has been scheduled for follow-up visit per 02/11/24 LOS.  Pt given an appt calendar with date and time.

## 2024-02-12 ENCOUNTER — Other Ambulatory Visit: Payer: Self-pay

## 2024-02-13 ENCOUNTER — Inpatient Hospital Stay: Payer: Self-pay

## 2024-02-13 VITALS — BP 118/67 | HR 71 | Temp 98.0°F | Resp 18

## 2024-02-13 DIAGNOSIS — C787 Secondary malignant neoplasm of liver and intrahepatic bile duct: Secondary | ICD-10-CM

## 2024-02-13 NOTE — Patient Instructions (Signed)
 Fluorouracil Injection What is this medication? FLUOROURACIL (flure oh YOOR a sil) treats some types of cancer. It works by slowing down the growth of cancer cells. This medicine may be used for other purposes; ask your health care provider or pharmacist if you have questions. COMMON BRAND NAME(S): Adrucil What should I tell my care team before I take this medication? They need to know if you have any of these conditions: Blood disorders Dihydropyrimidine dehydrogenase (DPD) deficiency Infection, such as chickenpox, cold sores, herpes Kidney disease Liver disease Poor nutrition Recent or ongoing radiation therapy An unusual or allergic reaction to fluorouracil, other medications, foods, dyes, or preservatives If you or your partner are pregnant or trying to get pregnant Breast-feeding How should I use this medication? This medication is injected into a vein. It is administered by your care team in a hospital or clinic setting. Talk to your care team about the use of this medication in children. Special care may be needed. Overdosage: If you think you have taken too much of this medicine contact a poison control center or emergency room at once. NOTE: This medicine is only for you. Do not share this medicine with others. What if I miss a dose? Keep appointments for follow-up doses. It is important not to miss your dose. Call your care team if you are unable to keep an appointment. What may interact with this medication? Do not take this medication with any of the following: Live virus vaccines This medication may also interact with the following: Medications that treat or prevent blood clots, such as warfarin, enoxaparin, dalteparin This list may not describe all possible interactions. Give your health care provider a list of all the medicines, herbs, non-prescription drugs, or dietary supplements you use. Also tell them if you smoke, drink alcohol, or use illegal drugs. Some items may  interact with your medicine. What should I watch for while using this medication? Your condition will be monitored carefully while you are receiving this medication. This medication may make you feel generally unwell. This is not uncommon as chemotherapy can affect healthy cells as well as cancer cells. Report any side effects. Continue your course of treatment even though you feel ill unless your care team tells you to stop. In some cases, you may be given additional medications to help with side effects. Follow all directions for their use. This medication may increase your risk of getting an infection. Call your care team for advice if you get a fever, chills, sore throat, or other symptoms of a cold or flu. Do not treat yourself. Try to avoid being around people who are sick. This medication may increase your risk to bruise or bleed. Call your care team if you notice any unusual bleeding. Be careful brushing or flossing your teeth or using a toothpick because you may get an infection or bleed more easily. If you have any dental work done, tell your dentist you are receiving this medication. Avoid taking medications that contain aspirin, acetaminophen, ibuprofen, naproxen, or ketoprofen unless instructed by your care team. These medications may hide a fever. Do not treat diarrhea with over the counter products. Contact your care team if you have diarrhea that lasts more than 2 days or if it is severe and watery. This medication can make you more sensitive to the sun. Keep out of the sun. If you cannot avoid being in the sun, wear protective clothing and sunscreen. Do not use sun lamps, tanning beds, or tanning booths. Talk to  your care team if you or your partner wish to become pregnant or think you might be pregnant. This medication can cause serious birth defects if taken during pregnancy and for 3 months after the last dose. A reliable form of contraception is recommended while taking this  medication and for 3 months after the last dose. Talk to your care team about effective forms of contraception. Do not father a child while taking this medication and for 3 months after the last dose. Use a condom while having sex during this time period. Do not breastfeed while taking this medication. This medication may cause infertility. Talk to your care team if you are concerned about your fertility. What side effects may I notice from receiving this medication? Side effects that you should report to your care team as soon as possible: Allergic reactions--skin rash, itching, hives, swelling of the face, lips, tongue, or throat Heart attack--pain or tightness in the chest, shoulders, arms, or jaw, nausea, shortness of breath, cold or clammy skin, feeling faint or lightheaded Heart failure--shortness of breath, swelling of the ankles, feet, or hands, sudden weight gain, unusual weakness or fatigue Heart rhythm changes--fast or irregular heartbeat, dizziness, feeling faint or lightheaded, chest pain, trouble breathing High ammonia level--unusual weakness or fatigue, confusion, loss of appetite, nausea, vomiting, seizures Infection--fever, chills, cough, sore throat, wounds that don't heal, pain or trouble when passing urine, general feeling of discomfort or being unwell Low red blood cell level--unusual weakness or fatigue, dizziness, headache, trouble breathing Pain, tingling, or numbness in the hands or feet, muscle weakness, change in vision, confusion or trouble speaking, loss of balance or coordination, trouble walking, seizures Redness, swelling, and blistering of the skin over hands and feet Severe or prolonged diarrhea Unusual bruising or bleeding Side effects that usually do not require medical attention (report to your care team if they continue or are bothersome): Dry skin Headache Increased tears Nausea Pain, redness, or swelling with sores inside the mouth or throat Sensitivity  to light Vomiting This list may not describe all possible side effects. Call your doctor for medical advice about side effects. You may report side effects to FDA at 1-800-FDA-1088. Where should I keep my medication? This medication is given in a hospital or clinic. It will not be stored at home. NOTE: This sheet is a summary. It may not cover all possible information. If you have questions about this medicine, talk to your doctor, pharmacist, or health care provider.  2024 [employer] Standard (2021-08-23 00:00:00)

## 2024-02-18 ENCOUNTER — Other Ambulatory Visit: Payer: Self-pay

## 2024-02-18 ENCOUNTER — Encounter: Payer: Self-pay | Admitting: Oncology

## 2024-02-18 DIAGNOSIS — F17210 Nicotine dependence, cigarettes, uncomplicated: Secondary | ICD-10-CM

## 2024-02-18 MED ORDER — NICOTINE 21 MG/24HR TD PT24: MEDICATED_PATCH | TRANSDERMAL | 0 refills | Status: DC

## 2024-02-18 NOTE — Progress Notes (Signed)
 [facility] CARE CLINIC CONSULT NOTE [facility]  Telephone:([phone] [phone]  Patient Care Team: Silver Lamar LABOR, MD as PCP - General (Family Medicine) Ezzard Valaria LABOR, MD as Consulting Physician (Oncology)   Name of the patient: Robert Mitchell  [id number]  1965-05-16   Date of visit: 02/18/24  Diagnosis- Colorectal Cancer  Chief complaint/Reason for visit- Initial Meeting for [facility], preparing for starting chemotherapy   Heme/Onc history:  Oncology History  Rectal cancer metastasized to liver [facility])  12/19/2023 Initial Diagnosis   Rectal cancer metastasized to liver [facility])   01/14/2024 -  Chemotherapy   Patient is on Treatment Plan : COLORECTAL FOLFOX q14d x 3 months       Interval history-  Patient presents to chemo care clinic today for initial meeting in preparation for starting chemotherapy. I introduced the chemo care clinic and we discussed that the role of the clinic is to assist those who are at an increased risk of emergency room visits and/or complications during the course of chemotherapy treatment. We discussed that the increased risk takes into account factors such as age, performance status, and co-morbidities. We also discussed that for some, this might include barriers to care such as not having a primary care provider, lack of insurance/transportation, or not being able to afford medications. We discussed that the goal of the program is to help prevent unplanned ER visits and help reduce complications during chemotherapy. We do this by discussing specific risk factors to each individual and identifying ways that we can help improve these risk factors and reduce barriers to care.   No Known Allergies  Past Medical History:  Diagnosis Date   Arthritis    Colon cancer (HCC)    Diarrhea    GERD (gastroesophageal reflux disease)    Hiatal hernia    History of basal cell carcinoma (BCC) excision    RIGHT FACE   History  of renal calculi    Pneumonia    UTI (urinary tract infection)     Past Surgical History:  Procedure Laterality Date   COLONOSCOPY W/ POLYPECTOMY     SKIN CANCER EXCISION     BASAL CELL OF RIGHT FACE    Social History   Socioeconomic History   Marital status: Married    Spouse name: DENISE   Number of children: 1   Years of education: 8   Highest education level: Not on file  Occupational History   Occupation: CONSTRUCTION  Tobacco Use   Smoking status: Former    Current packs/day: 0.00    Types: Cigarettes    Quit date: 1994    Years since quitting: 31.8   Smokeless tobacco: Current    Types: Chew  Vaping Use   Vaping status: Never Used  Substance and Sexual Activity   Alcohol use: Not Currently   Drug use: Not Currently   Sexual activity: Not Currently  Other Topics Concern   Not on file  Social History Narrative   Not on file   Social Drivers of Health   Financial Resource Strain: Not on file  Food Insecurity: Low Risk  (01/15/2024)   Received from [facility]   Hunger Vital Sign    Within the past 12 months, you worried that your food would run out before you got money to buy more: Never true    Within the past 12 months, the food you bought just didn't last and you didn't have money to get more. : Never true  Transportation Needs: No Transportation Needs (01/15/2024)   Received from [facility]    In the past 12 months, has lack of reliable transportation kept you from medical appointments, meetings, work or from getting things needed for daily living? : No  Physical Activity: Not on file  Stress: Not on file  Social Connections: Not on file  Intimate Partner Violence: Not At Risk (12/19/2023)   Humiliation, Afraid, Rape, and Kick questionnaire    Fear of Current or Ex-Partner: No    Emotionally Abused: No    Physically Abused: No    Sexually Abused: No    Family History  Problem Relation Age of Onset   Colitis Sister    Aortic  aneurysm Brother    Colon cancer Maternal Uncle    Prostate cancer Maternal Uncle    Prostate cancer Paternal Uncle    Colon cancer Paternal Grandmother      Current Outpatient Medications:    prochlorperazine  (COMPAZINE ) 10 MG tablet, Take 1 tablet (10 mg total) by mouth every 6 (six) hours as needed for nausea or vomiting., Disp: 90 tablet, Rfl: 3   fluticasone  (FLONASE ) 50 MCG/ACT nasal spray, Place 1 spray into both nostrils 2 (two) times daily as needed for rhinitis., Disp: 17 mL, Rfl: 0   hydrocortisone  (ANUSOL -HC) 2.5 % rectal cream, Place 1 Application rectally 2 (two) times daily., Disp: 30 g, Rfl: 1   loperamide (IMODIUM A-D) 2 MG tablet, Take 2 mg by mouth 4 (four) times daily as needed for diarrhea or loose stools., Disp: , Rfl:    mirtazapine  (REMERON ) 15 MG tablet, TAKE 1 TABLET BY MOUTH EVERYDAY AT BEDTIME, Disp: 90 tablet, Rfl: 1   nicotine  (NICODERM CQ  - DOSED IN MG/24 HOURS) 14 mg/24hr patch, RX #2 Weeks 5-6: 14 mg x 1 patch daily. Wear for 24 hours. If you have sleep disturbances, remove at bedtime., Disp: 14 patch, Rfl: 0   nicotine  (NICODERM CQ  - DOSED IN MG/24 HOURS) 21 mg/24hr patch, RX #1 Weeks 1-4: 21 mg x 1 patch daily. Wear for 24 hours. If you have sleep disturbances, remove at bedtime., Disp: 28 patch, Rfl: 0   nicotine  (NICODERM CQ  - DOSED IN MG/24 HR) 7 mg/24hr patch, RX #3 Weeks 7-8: 7 mg x 1 patch daily. Wear for 24 hours. If you have sleep disturbances, remove at bedtime., Disp: 14 patch, Rfl: 0   NON FORMULARY, Take 18 Billion Cells by mouth as directed. PB RESTORE - PRO-& POSTBIOTICS AND BACTERIOPHAGES, Disp: , Rfl:    NON FORMULARY, Take 7 Billion Cells by mouth as directed. PB ASSIST+ PROBIOME GUT COMPLEX, Disp: , Rfl:    NON FORMULARY, Take 900 mg by mouth as directed. ESSENTIAL OIL OMEGA COMPLEX - THREE TABS DAILY, Disp: , Rfl:    NON FORMULARY, FRANKINCENSE BOSWELLIC ACID COMPLEX - INFLAMMATION AND PAIN, Disp: , Rfl:    NON FORMULARY, COPAIBA - IMMUNE  SYSTEM, INFLAMMATION & PAIN, Disp: , Rfl:    NON FORMULARY, DDR PRIME - IMMUNE SYSTEM SUPPORT & CELLULAR SUPPORT, Disp: , Rfl:    NON FORMULARY, SERENITY - SLEEP & RELAXATION, Disp: , Rfl:    NON FORMULARY, TUMERIC - INFLAMMATION AND JOINT PAIN, Disp: , Rfl:    NON FORMULARY, VITAMIN C - IMMUNE SYSTEM SUPPORT, Disp: , Rfl:    omeprazole  (PRILOSEC) 40 MG capsule, Take 1 capsule (40 mg total) by mouth 2 (two) times daily for 14 days., Disp: 28 capsule, Rfl: 0   ondansetron  (ZOFRAN ) 4 MG  tablet, Take 1 tablet (4 mg total) by mouth every 8 (eight) hours as needed for nausea or vomiting., Disp: 30 tablet, Rfl: 1   pantoprazole  (PROTONIX ) 40 MG tablet, Take 1 tablet (40 mg total) by mouth daily., Disp: 90 tablet, Rfl: 0   testosterone cypionate (DEPOTESTOSTERONE CYPIONATE) 200 MG/ML injection, Inject 200 mg into the muscle once a week., Disp: , Rfl:   Current Facility-Administered Medications:    0.9 %  sodium chloride  infusion, 500 mL, Intravenous, Once, Charlanne Groom, MD     Latest Ref Rng & Units 02/11/2024    8:48 AM  CMP  Glucose 70 - 99 mg/dL 93   BUN 6 - 20 mg/dL 14   Creatinine 9.38 - 1.24 mg/dL 9.03   Sodium 864 - 854 mmol/L 142   Potassium 3.5 - 5.1 mmol/L 4.0   Chloride 98 - 111 mmol/L 105   CO2 22 - 32 mmol/L 25   Calcium  8.9 - 10.3 mg/dL 9.3   Total Protein 6.5 - 8.1 g/dL 6.8   Total Bilirubin 0.0 - 1.2 mg/dL 0.3   Alkaline Phos 38 - 126 U/L 91   AST 15 - 41 U/L 50   ALT 0 - 44 U/L 98       Latest Ref Rng & Units 02/11/2024    8:48 AM  CBC  WBC 4.0 - 10.5 K/uL 4.0   Hemoglobin 13.0 - 17.0 g/dL 85.9   Hematocrit 60.9 - 52.0 % 41.9   Platelets 150 - 400 K/uL 148       No results found.   Assessment and plan- Patient is a 58 y.o. male who presents to Chemo Care Clinic for initial meeting in preparation for starting chemotherapy for the treatment of metastatic rectal cancer.   Chemo Care Clinic/High Risk for ER/Hospitalization during chemotherapy- We discussed the  role of the chemo care clinic and identified patient specific risk factors. I discussed that patient was identified as high risk primarily based on:  Patient has past medical history positive for: Past Medical History:  Diagnosis Date   Arthritis    Colon cancer (HCC)    Diarrhea    GERD (gastroesophageal reflux disease)    Hiatal hernia    History of basal cell carcinoma (BCC) excision    RIGHT FACE   History of renal calculi    Pneumonia    UTI (urinary tract infection)     Patient has past surgical history positive for: Past Surgical History:  Procedure Laterality Date   COLONOSCOPY W/ POLYPECTOMY     SKIN CANCER EXCISION     BASAL CELL OF RIGHT FACE     We discussed that social determinants of health may have significant impacts on health and outcomes for cancer patients.  Today we discussed specific social determinants of performance status, alcohol use, depression, financial needs, food insecurity, housing, interpersonal violence, social connections, stress, tobacco use, and transportation.    After lengthy discussion the following were identified as areas of need:   Outpatient services: We discussed options including home based and outpatient services, DME and care program. We discusssed that patients who participate in regular physical activity report fewer negative impacts of cancer and treatments and report less fatigue.   Financial Concerns: We discussed that living with cancer can create tremendous financial burden.  We discussed options for assistance. I asked that if assistance is needed in affording medications or paying bills to please let us  know so that we can provide assistance. We discussed options for  food including social services.  Referral to Social work: Discussed social worker Lizbeth Sprague and the services she can provide such as support with utility bill, cell phone and gas vouchers.   Support groups: We discussed options for support groups at the  cancer center. If interested, please notify nurse navigator to enroll. We discussed options for managing stress including healthy eating, exercise as well as participating in no charge counseling services at the cancer center and support groups.  If these are of interest, patient can notify either myself or primary nursing team.We discussed options for management including medications and referral to quit Smart program  Transportation: We discussed options for transportation including [employer], paratransit, bus routes, link transit, taxi/uber/lyft, and cancer center van.  I have notified primary oncology team who will help assist with arranging fleeta transportation for appointments when/if needed. We also discussed options for transportation on short notice/acute visits.  Palliative care services: We have palliative care services available in the cancer center to discuss goals of care and advanced care planning.  Please let us  know if you have any questions or would like to speak to our palliative nurse practitioner.  Symptom Management Clinic: We discussed our symptom management clinic which is available for acute concerns while receiving treatment such as nausea, vomiting or diarrhea.  We can be reached via telephone at [phone] or through my chart.  We are available for virtual or in person visits on the same day from 830 to 4 PM Monday through Friday. He denies needing specific assistance at this time and He will be followed by Dr. Trudi clinical team.  Plan: Discussed symptom management clinic. Discussed palliative care services. Discussed resources that are available here at the cancer center. Discussed medications and new prescriptions to begin treatment such as anti-nausea or steroids.   The patient is a with newly diagnosed.  Patient presents to clinic today for chemotherapy education and palliative care consult.  We will start.  We will send in prescriptions for prochlorperazine  and  ondansetron .  The patient verbalizes understanding of and agreement to the plan as discussed today.  Provided general information including the following: 1.  Date of education: 01/10/2024 2.  Physician name: Valaria Kerns, MD 3.  Diagnosis: Colorectal cancer 4.  Stage: Metastasized 5.  Palliative 6.  Chemotherapy plan including drugs and how often: Fluorouracil , Leucovorin , Oxaliplatin  7.  Start date: Pending 8.  Other referrals: Social Work 9.  The patient is to call our office with any questions or concerns.  Our office number [phone], if after hours or on the weekend, call the same number and wait for the answering service.  There is always an oncologist on call 10.  Medications prescribed: Ondansetron , Prochlorperazine  11.  The patient has verbalized understanding of the treatment plan and has no barriers to adherence or understanding.  Obtained signed consent from patient.  Discussed symptoms including 1.  Low blood counts including red blood cells, white blood cells and platelets. 2. Infection including to avoid large crowds, wash hands frequently, and stay away from people who were sick.  If fever develops of 100.4 or higher, call our office. 3.  Mucositis-given instructions on mouth rinse (baking soda and salt mixture).  Keep mouth clean.  Use soft bristle toothbrush.  If mouth sores develop, call our clinic. 4.  Nausea/vomiting-gave prescriptions for ondansetron  4 mg every 4 hours as needed for nausea, may take around the clock if persistent.  Compazine  10 mg every 6 hours, may take around the clock  if persistent. 5.  Diarrhea-use over-the-counter Imodium.  Call clinic if not controlled. 6.  Constipation-use senna, 1 to 2 tablets twice a day.  If no BM in 2 to 3 days call the clinic. 7.  Loss of appetite-try to eat small meals every 2-3 hours.  Call clinic if not eating. 8.  Taste changes-zinc 500 mg daily.  If becomes severe call clinic. 9.  Alcoholic beverages. 10.  Drink  2 to 3 quarts of water per day. 11.  Peripheral neuropathy-patient to call if numbness or tingling in hands or feet is persistent  Neulasta-will be given 24 to 48 hours after chemotherapy.  Gave information sheet on bone and joint pain.  Use Claritin or Pepcid.  May use ibuprofen or Aleve.  Call if symptoms persist or are unbearable.  Gave information on the supportive care team and how to contact them regarding services.  Discussed advanced directives.  The patient does not have their advanced directives but will look at the copy provided in their notebook and will call with any questions. [employer] Social worker Advanced directives  Answered questions to patient satisfaction.  Patient is to call with any further questions or concerns.  Time spent on this palliative care/chemotherapy education was 60 minutes with more than 50% spent discussing diagnosis, prognosis and symptom management.  The medication prescribed to the patient will be printed out from [email or web address] This will give the following information: Name of your medication Approved uses Dose and schedule Storage and handling Handling body fluids and waste Drug and food interactions Possible side effects and management Pregnancy, sexual activity, and contraception Obtaining medication   Disposition: RTC on   Visit Diagnosis No diagnosis found.  Patient expressed understanding and was in agreement with this plan. He also understands that He can call clinic at any time with any questions, concerns, or complaints.   I provided 60 minutes of face to face during this encounter, and > 50% was spent counseling as documented under my assessment & plan.   Eleanor Bach, FNP- [facility] [phone]

## 2024-02-19 ENCOUNTER — Other Ambulatory Visit: Payer: Self-pay | Admitting: Hematology and Oncology

## 2024-02-19 ENCOUNTER — Telehealth: Payer: Self-pay

## 2024-02-19 DIAGNOSIS — F17210 Nicotine dependence, cigarettes, uncomplicated: Secondary | ICD-10-CM

## 2024-02-19 NOTE — Telephone Encounter (Signed)
 Pt states he is going to see a naturopathic physician. He is asking if there are any vitamins he should avoid? I sent the message to Melissa P,NP and Brandy,RPH.

## 2024-02-22 ENCOUNTER — Other Ambulatory Visit: Payer: Self-pay | Admitting: Gastroenterology

## 2024-02-25 ENCOUNTER — Inpatient Hospital Stay: Payer: Self-pay

## 2024-02-25 ENCOUNTER — Other Ambulatory Visit: Payer: Self-pay

## 2024-02-25 ENCOUNTER — Inpatient Hospital Stay: Payer: Self-pay | Admitting: Hematology and Oncology

## 2024-02-25 VITALS — BP 131/80 | HR 68 | Temp 98.9°F | Resp 16 | Ht 67.0 in | Wt 180.4 lb

## 2024-02-25 DIAGNOSIS — C20 Malignant neoplasm of rectum: Secondary | ICD-10-CM

## 2024-02-25 DIAGNOSIS — C787 Secondary malignant neoplasm of liver and intrahepatic bile duct: Secondary | ICD-10-CM

## 2024-02-25 LAB — CMP (CANCER CENTER ONLY)
ALT: 96 U/L — ABNORMAL HIGH (ref 0–44)
AST: 48 U/L — ABNORMAL HIGH (ref 15–41)
Albumin: 3.8 g/dL (ref 3.5–5.0)
Alkaline Phosphatase: 92 U/L (ref 38–126)
Anion gap: 10 (ref 5–15)
BUN: 9 mg/dL (ref 6–20)
CO2: 25 mmol/L (ref 22–32)
Calcium: 9.7 mg/dL (ref 8.9–10.3)
Chloride: 106 mmol/L (ref 98–111)
Creatinine: 1.05 mg/dL (ref 0.61–1.24)
GFR, Estimated: >60 mL/min (ref >60)
Glucose, Bld: 86 mg/dL (ref 70–99)
Potassium: 4.2 mmol/L (ref 3.5–5.1)
Sodium: 141 mmol/L (ref 135–145)
Total Bilirubin: 0.3 mg/dL (ref 0.0–1.2)
Total Protein: 6.6 g/dL (ref 6.5–8.1)

## 2024-02-25 LAB — CBC WITH DIFFERENTIAL (CANCER CENTER ONLY)
Abs Immature Granulocytes: 0 K/uL (ref 0.00–0.07)
Basophils Absolute: 0 K/uL (ref 0.0–0.1)
Basophils Relative: 1 %
Eosinophils Absolute: 0.1 K/uL (ref 0.0–0.5)
Eosinophils Relative: 2 %
HCT: 41.2 % (ref 39.0–52.0)
Hemoglobin: 13.5 g/dL (ref 13.0–17.0)
Immature Granulocytes: 0 %
Lymphocytes Relative: 37 %
Lymphs Abs: 1.5 K/uL (ref 0.7–4.0)
MCH: 28.9 pg (ref 26.0–34.0)
MCHC: 32.8 g/dL (ref 30.0–36.0)
MCV: 88.2 fL (ref 80.0–100.0)
Monocytes Absolute: 0.5 K/uL (ref 0.1–1.0)
Monocytes Relative: 13 %
Neutro Abs: 2 K/uL (ref 1.7–7.7)
Neutrophils Relative %: 47 %
Platelet Count: 141 K/uL — ABNORMAL LOW (ref 150–400)
RBC: 4.67 MIL/uL (ref 4.22–5.81)
RDW: 18.1 % — ABNORMAL HIGH (ref 11.5–15.5)
WBC Count: 4.1 K/uL (ref 4.0–10.5)
nRBC: 0 % (ref 0.0–0.2)

## 2024-02-25 MED ORDER — OXALIPLATIN CHEMO INJECTION 100 MG/20ML
87.0000 mg/m2 | Freq: Once | INTRAVENOUS | Status: AC
  Administered 2024-02-25: 165 mg via INTRAVENOUS
  Filled 2024-02-25: qty 33

## 2024-02-25 MED ORDER — LEUCOVORIN CALCIUM INJECTION 350 MG
400.0000 mg/m2 | Freq: Once | INTRAVENOUS | Status: AC
  Administered 2024-02-25: 760 mg via INTRAVENOUS
  Filled 2024-02-25: qty 38

## 2024-02-25 MED ORDER — DEXTROSE 5 % IV SOLN: INTRAVENOUS | Status: DC

## 2024-02-25 MED ORDER — PALONOSETRON HCL INJECTION 0.25 MG/5ML
0.2500 mg | Freq: Once | INTRAVENOUS | Status: AC
  Administered 2024-02-25: 0.25 mg via INTRAVENOUS
  Filled 2024-02-25: qty 5

## 2024-02-25 MED ORDER — SODIUM CHLORIDE 0.9 % IV SOLN
2400.0000 mg/m2 | INTRAVENOUS | Status: DC
  Administered 2024-02-25: 5000 mg via INTRAVENOUS
  Filled 2024-02-25: qty 100

## 2024-02-25 MED ORDER — DEXAMETHASONE SOD PHOSPHATE PF 10 MG/ML IJ SOLN
10.0000 mg | Freq: Once | INTRAMUSCULAR | Status: AC
  Administered 2024-02-25: 10 mg via INTRAVENOUS

## 2024-02-25 MED ORDER — FLUOROURACIL CHEMO INJECTION 2.5 GM/50ML
400.0000 mg/m2 | Freq: Once | INTRAVENOUS | Status: AC
  Administered 2024-02-25: 750 mg via INTRAVENOUS
  Filled 2024-02-25: qty 15

## 2024-02-25 NOTE — Progress Notes (Signed)
 [facility] [address] [place],  [place]  [address] [phone]  Clinic Day:  02/25/2024  Referring physician: Silver Lamar LABOR, MD   HISTORY OF PRESENT ILLNESS:  The patient is a 58 y.o. male with stage IVA (T3 N2a M1a) rectal cancer, which includes 1 metastatic liver lesion.  He is currently receiving FOLFOX chemotherapy with the hope that it will shrink both his rectal cancer and his metastatic liver lesion to where both could be synchronously resected.  He comes in today to be evaluated before heading into his 3rd cycle of FOLFOX chemotherapy.  The patient tolerated his 3rd cycle of FOLFOX fairly well.  However, he has begun to have more constipation.  In the past few years, diarrhea has been a major issue.  Otherwise, he is doing well and is willing to proceed with his next cycle of treatment.  PHYSICAL EXAM:  Blood pressure 131/80, pulse 68, temperature 98.9 F (37.2 C), temperature source Oral, resp. rate 16, height 5' 7 (1.702 m), weight 180 lb 6.4 oz (81.8 kg), SpO2 100%. Wt Readings from Last 3 Encounters:  02/25/24 180 lb 6.4 oz (81.8 kg)  02/11/24 170 lb 4.8 oz (77.2 kg)  01/28/24 171 lb (77.6 kg)   Body mass index is 28.25 kg/m. Performance status (ECOG): 1 - Symptomatic but completely ambulatory Physical Exam Constitutional:      Appearance: Normal appearance. He is not ill-appearing.  HENT:     Mouth/Throat:     Mouth: Mucous membranes are moist.     Pharynx: Oropharynx is clear. No oropharyngeal exudate or posterior oropharyngeal erythema.  Cardiovascular:     Rate and Rhythm: Normal rate and regular rhythm.     Heart sounds: No murmur heard.    No friction rub. No gallop.  Pulmonary:     Effort: Pulmonary effort is normal. No respiratory distress.     Breath sounds: Normal breath sounds. No wheezing, rhonchi or rales.  Abdominal:     General: Bowel sounds are normal. There is no distension.     Palpations: Abdomen is  soft. There is no mass.     Tenderness: There is no abdominal tenderness.  Musculoskeletal:        General: No swelling.     Right lower leg: No edema.     Left lower leg: No edema.  Lymphadenopathy:     Cervical: No cervical adenopathy.     Upper Body:     Right upper body: No supraclavicular or axillary adenopathy.     Left upper body: No supraclavicular or axillary adenopathy.     Lower Body: No right inguinal adenopathy. No left inguinal adenopathy.  Skin:    General: Skin is warm.     Coloration: Skin is not jaundiced.     Findings: No lesion or rash.  Neurological:     General: No focal deficit present.     Mental Status: He is alert and oriented to person, place, and time. Mental status is at baseline.  Psychiatric:        Mood and Affect: Mood normal.        Behavior: Behavior normal.        Thought Content: Thought content normal.     LABS:      Latest Ref Rng & Units 02/25/2024    8:42 AM 02/11/2024    8:48 AM 01/28/2024    8:33 AM  CBC  WBC 4.0 - 10.5 K/uL 4.1  4.0  4.0  Hemoglobin 13.0 - 17.0 g/dL 86.4  85.9  86.1   Hematocrit 39.0 - 52.0 % 41.2  41.9  42.0   Platelets 150 - 400 K/uL 141  148  225       Latest Ref Rng & Units 02/25/2024    8:42 AM 02/11/2024    8:48 AM 01/28/2024    8:33 AM  CMP  Glucose 70 - 99 mg/dL 86  93  886   BUN 6 - 20 mg/dL 9  14  12    Creatinine 0.61 - 1.24 mg/dL 8.94  9.03  8.93   Sodium 135 - 145 mmol/L 141  142  142   Potassium 3.5 - 5.1 mmol/L 4.2  4.0  3.6   Chloride 98 - 111 mmol/L 106  105  104   CO2 22 - 32 mmol/L 25  25  25    Calcium  8.9 - 10.3 mg/dL 9.7  9.3  9.5   Total Protein 6.5 - 8.1 g/dL 6.6  6.8  6.9   Total Bilirubin 0.0 - 1.2 mg/dL 0.3  0.3  0.3   Alkaline Phos 38 - 126 U/L 92  91  81   AST 15 - 41 U/L 48  50  17   ALT 0 - 44 U/L 96  98  15    Lab Results  Component Value Date   CEA 32.92 (H) 02/11/2024   CEA 6.9 (H) 11/27/2023   PATHOLOGY (11-27-23): FINAL DIAGNOSIS        1. Duodenum, Biopsy,   :       - DUODENAL MUCOSA WITH NO SPECIFIC HISTOPATHOLOGIC CHANGES       - NEGATIVE FOR INCREASED INTRAEPITHELIAL LYMPHOCYTES OR VILLOUS ARCHITECTURAL       CHANGES        2. 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 Ascending  Colon Polyp, x 1 :       - TUBULAR ADENOMA(S)       - NEGATIVE FOR HIGH-GRADE DYSPLASIA OR MALIGNANCY        4. Rectum, biopsy, circumferential apple core mass :       - INVASIVE MODERATELY DIFFERENTIATED ADENOCARCINOMA, SEE COMMENT    STUDIES: His PET scan from 12/24/2023 revealed the following:  FINDINGS: Mediastinal blood-pool activity (background): SUV max = 2.2  Liver activity (reference): SUV max = N/A  NECK: No hypermetabolic lymph nodes or masses.  Incidental CT findings: None.  CHEST: No hypermetabolic lymph nodes. No suspicious pulmonary nodules seen on CT images.  Incidental CT findings: None.  ABDOMEN/PELVIS: 5 cm low-attenuation mass near junction of right and left hepatic lobes shows peripheral hypermetabolism with central necrosis. This has SUV max of 9.6, consistent liver metastasis. No other hypermetabolic liver lesions are seen. No abnormal hypermetabolic activity within the pancreas, adrenal glands, or spleen.  Circumferential rectal mass is seen which shows hypermetabolism, with SUV max of 27.4, consistent with rectal carcinoma. Extramural vascular invasion is again seen in the left anterior perirectal region which is hypermetabolic.  Mildly hypermetabolic mesorectal lymph nodes are seen in the left lateral and posterior perirectal space and sigmoid mesocolon, with SUV max measuring up to 3.9. Largest lymph node measures 10 mm in the left perirectal space. These are consistent with local lymph node metastases. No hypermetabolic extra-mesorectal lymph nodes seen.  Incidental CT findings: 11 mm nonobstructing left renal calculus.  SKELETON: No focal  hypermetabolic bone lesions to suggest skeletal metastasis.  Incidental CT findings: None.  IMPRESSION: Hypermetabolic circumferential rectal mass, consistent with primary rectal carcinoma.  Local lymph node metastases in the mesorectal space and sigmoid mesocolon.  5 cm hypermetabolic metastasis in the left hepatic lobe.  No evidence of metastatic disease in the neck or chest.  11 mm nonobstructing left renal calculus.  ASSESSMENT & PLAN:  A 58 y.o. male with stage IVA (T3 N2a M1a) rectal cancer.  He will proceed with his 4th of 6 planned cycles of neoadjuvant FOLFOX chemotherapy today.  With respect to his constipation, the patient was told to use either magnesium citrate or milk of magnesia to elicit bowel movements.  He also knows to use standard laxatives on a daily basis to further promote more regular bowel movements. He has been evaluated by a holistic provider who recommended Vitamin C infusions. We discussed that there are not many studies available regarding alternative treatments with chemotherapy and based on that, we do not recommend them. He currently does light therapy daily and states he wants to do as many treatment options available, although he doesn't want to do anything that would interfere with his chemotherapy.  Otherwise, he appears to be doing very well.  I will see him back in 2 weeks before he heads into his 5th cycle of neoadjuvant FOLFOX chemotherapy. The patient understands all the plans discussed today and is in agreement with them.  Eleanor DELENA Bach, NP

## 2024-02-25 NOTE — Patient Instructions (Signed)
 Fluorouracil Injection What is this medication? FLUOROURACIL (flure oh YOOR a sil) treats some types of cancer. It works by slowing down the growth of cancer cells. This medicine may be used for other purposes; ask your health care provider or pharmacist if you have questions. COMMON BRAND NAME(S): Adrucil What should I tell my care team before I take this medication? They need to know if you have any of these conditions: Blood disorders Dihydropyrimidine dehydrogenase (DPD) deficiency Infection, such as chickenpox, cold sores, herpes Kidney disease Liver disease Poor nutrition Recent or ongoing radiation therapy An unusual or allergic reaction to fluorouracil, other medications, foods, dyes, or preservatives If you or your partner are pregnant or trying to get pregnant Breast-feeding How should I use this medication? This medication is injected into a vein. It is administered by your care team in a hospital or clinic setting. Talk to your care team about the use of this medication in children. Special care may be needed. Overdosage: If you think you have taken too much of this medicine contact a poison control center or emergency room at once. NOTE: This medicine is only for you. Do not share this medicine with others. What if I miss a dose? Keep appointments for follow-up doses. It is important not to miss your dose. Call your care team if you are unable to keep an appointment. What may interact with this medication? Do not take this medication with any of the following: Live virus vaccines This medication may also interact with the following: Medications that treat or prevent blood clots, such as warfarin, enoxaparin, dalteparin This list may not describe all possible interactions. Give your health care provider a list of all the medicines, herbs, non-prescription drugs, or dietary supplements you use. Also tell them if you smoke, drink alcohol, or use illegal drugs. Some items may  interact with your medicine. What should I watch for while using this medication? Your condition will be monitored carefully while you are receiving this medication. This medication may make you feel generally unwell. This is not uncommon as chemotherapy can affect healthy cells as well as cancer cells. Report any side effects. Continue your course of treatment even though you feel ill unless your care team tells you to stop. In some cases, you may be given additional medications to help with side effects. Follow all directions for their use. This medication may increase your risk of getting an infection. Call your care team for advice if you get a fever, chills, sore throat, or other symptoms of a cold or flu. Do not treat yourself. Try to avoid being around people who are sick. This medication may increase your risk to bruise or bleed. Call your care team if you notice any unusual bleeding. Be careful brushing or flossing your teeth or using a toothpick because you may get an infection or bleed more easily. If you have any dental work done, tell your dentist you are receiving this medication. Avoid taking medications that contain aspirin, acetaminophen, ibuprofen, naproxen, or ketoprofen unless instructed by your care team. These medications may hide a fever. Do not treat diarrhea with over the counter products. Contact your care team if you have diarrhea that lasts more than 2 days or if it is severe and watery. This medication can make you more sensitive to the sun. Keep out of the sun. If you cannot avoid being in the sun, wear protective clothing and sunscreen. Do not use sun lamps, tanning beds, or tanning booths. Talk to  your care team if you or your partner wish to become pregnant or think you might be pregnant. This medication can cause serious birth defects if taken during pregnancy and for 3 months after the last dose. A reliable form of contraception is recommended while taking this  medication and for 3 months after the last dose. Talk to your care team about effective forms of contraception. Do not father a child while taking this medication and for 3 months after the last dose. Use a condom while having sex during this time period. Do not breastfeed while taking this medication. This medication may cause infertility. Talk to your care team if you are concerned about your fertility. What side effects may I notice from receiving this medication? Side effects that you should report to your care team as soon as possible: Allergic reactions--skin rash, itching, hives, swelling of the face, lips, tongue, or throat Heart attack--pain or tightness in the chest, shoulders, arms, or jaw, nausea, shortness of breath, cold or clammy skin, feeling faint or lightheaded Heart failure--shortness of breath, swelling of the ankles, feet, or hands, sudden weight gain, unusual weakness or fatigue Heart rhythm changes--fast or irregular heartbeat, dizziness, feeling faint or lightheaded, chest pain, trouble breathing High ammonia level--unusual weakness or fatigue, confusion, loss of appetite, nausea, vomiting, seizures Infection--fever, chills, cough, sore throat, wounds that don't heal, pain or trouble when passing urine, general feeling of discomfort or being unwell Low red blood cell level--unusual weakness or fatigue, dizziness, headache, trouble breathing Pain, tingling, or numbness in the hands or feet, muscle weakness, change in vision, confusion or trouble speaking, loss of balance or coordination, trouble walking, seizures Redness, swelling, and blistering of the skin over hands and feet Severe or prolonged diarrhea Unusual bruising or bleeding Side effects that usually do not require medical attention (report to your care team if they continue or are bothersome): Dry skin Headache Increased tears Nausea Pain, redness, or swelling with sores inside the mouth or throat Sensitivity  to light Vomiting This list may not describe all possible side effects. Call your doctor for medical advice about side effects. You may report side effects to FDA at 1-800-FDA-1088. Where should I keep my medication? This medication is given in a hospital or clinic. It will not be stored at home. NOTE: This sheet is a summary. It may not cover all possible information. If you have questions about this medicine, talk to your doctor, pharmacist, or health care provider.  2024 Else[employer] Standard (2021-08-23 00:00:00)Leucovorin Injection What is this medication? LEUCOVORIN (loo koe VOR in) prevents side effects from certain medications, such as methotrexate. It works by increasing folate levels. This helps protect healthy cells in your body. It may also be used to treat anemia caused by low levels of folate. It can also be used with fluorouracil, a type of chemotherapy, to treat colorectal cancer. It works by increasing the effects of fluorouracil in the body. This medicine may be used for other purposes; ask your health care provider or pharmacist if you have questions. What should I tell my care team before I take this medication? They need to know if you have any of these conditions: Anemia from low levels of vitamin B12 in the blood An unusual or allergic reaction to leucovorin, folic acid, other medications, foods, dyes, or preservatives Pregnant or trying to get pregnant Breastfeeding How should I use this medication? This medication is injected into a vein or a muscle. It is given by your care team in  a hospital or clinic setting. Talk to your care team about the use of this medication in children. Special care may be needed. Overdosage: If you think you have taken too much of this medicine contact a poison control center or emergency room at once. NOTE: This medicine is only for you. Do not share this medicine with others. What if I miss a dose? Keep appointments for follow-up doses.  It is important not to miss your dose. Call your care team if you are unable to keep an appointment. What may interact with this medication? Capecitabine Fluorouracil Phenobarbital Phenytoin Primidone Trimethoprim;sulfamethoxazole This list may not describe all possible interactions. Give your health care provider a list of all the medicines, herbs, non-prescription drugs, or dietary supplements you use. Also tell them if you smoke, drink alcohol, or use illegal drugs. Some items may interact with your medicine. What should I watch for while using this medication? Your condition will be monitored carefully while you are receiving this medication. This medication may increase the side effects of 5-fluorouracil. Tell your care team if you have diarrhea or mouth sores that do not get better or that get worse. What side effects may I notice from receiving this medication? Side effects that you should report to your care team as soon as possible: Allergic reactions--skin rash, itching, hives, swelling of the face, lips, tongue, or throat This list may not describe all possible side effects. Call your doctor for medical advice about side effects. You may report side effects to FDA at 1-800-FDA-1088. Where should I keep my medication? This medication is given in a hospital or clinic. It will not be stored at home. NOTE: This sheet is a summary. It may not cover all possible information. If you have questions about this medicine, talk to your doctor, pharmacist, or health care provider.  2024 [employer]/Gold Standard (2021-09-20 00:00:00)Oxaliplatin Injection What is this medication? OXALIPLATIN (ox AL i PLA tin) treats colorectal cancer. It works by slowing down the growth of cancer cells. This medicine may be used for other purposes; ask your health care provider or pharmacist if you have questions. COMMON BRAND NAME(S): Eloxatin What should I tell my care team before I take this medication? They  need to know if you have any of these conditions: Heart disease History of irregular heartbeat or rhythm Liver disease Low blood cell levels (white cells, red cells, and platelets) Lung or breathing disease, such as asthma Take medications that treat or prevent blood clots Tingling of the fingers, toes, or other nerve disorder An unusual or allergic reaction to oxaliplatin, other medications, foods, dyes, or preservatives If you or your partner are pregnant or trying to get pregnant Breast-feeding How should I use this medication? This medication is injected into a vein. It is given by your care team in a hospital or clinic setting. Talk to your care team about the use of this medication in children. Special care may be needed. Overdosage: If you think you have taken too much of this medicine contact a poison control center or emergency room at once. NOTE: This medicine is only for you. Do not share this medicine with others. What if I miss a dose? Keep appointments for follow-up doses. It is important not to miss a dose. Call your care team if you are unable to keep an appointment. What may interact with this medication? Do not take this medication with any of the following: Cisapride Dronedarone Pimozide Thioridazine This medication may also interact with the following:  Aspirin and aspirin-like medications Certain medications that treat or prevent blood clots, such as warfarin, apixaban, dabigatran, and rivaroxaban Cisplatin Cyclosporine Diuretics Medications for infection, such as acyclovir, adefovir, amphotericin B, bacitracin, cidofovir, foscarnet, ganciclovir, gentamicin, pentamidine, vancomycin NSAIDs, medications for pain and inflammation, such as ibuprofen or naproxen Other medications that cause heart rhythm changes Pamidronate Zoledronic acid This list may not describe all possible interactions. Give your health care provider a list of all the medicines, herbs,  non-prescription drugs, or dietary supplements you use. Also tell them if you smoke, drink alcohol, or use illegal drugs. Some items may interact with your medicine. What should I watch for while using this medication? Your condition will be monitored carefully while you are receiving this medication. You may need blood work while taking this medication. This medication may make you feel generally unwell. This is not uncommon as chemotherapy can affect healthy cells as well as cancer cells. Report any side effects. Continue your course of treatment even though you feel ill unless your care team tells you to stop. This medication may increase your risk of getting an infection. Call your care team for advice if you get a fever, chills, sore throat, or other symptoms of a cold or flu. Do not treat yourself. Try to avoid being around people who are sick. Avoid taking medications that contain aspirin, acetaminophen, ibuprofen, naproxen, or ketoprofen unless instructed by your care team. These medications may hide a fever. Be careful brushing or flossing your teeth or using a toothpick because you may get an infection or bleed more easily. If you have any dental work done, tell your dentist you are receiving this medication. This medication can make you more sensitive to cold. Do not drink cold drinks or use ice. Cover exposed skin before coming in contact with cold temperatures or cold objects. When out in cold weather wear warm clothing and cover your mouth and nose to warm the air that goes into your lungs. Tell your care team if you get sensitive to the cold. Talk to your care team if you or your partner are pregnant or think either of you might be pregnant. This medication can cause serious birth defects if taken during pregnancy and for 9 months after the last dose. A negative pregnancy test is required before starting this medication. A reliable form of contraception is recommended while taking this  medication and for 9 months after the last dose. Talk to your care team about effective forms of contraception. Do not father a child while taking this medication and for 6 months after the last dose. Use a condom while having sex during this time period. Do not breastfeed while taking this medication and for 3 months after the last dose. This medication may cause infertility. Talk to your care team if you are concerned about your fertility. What side effects may I notice from receiving this medication? Side effects that you should report to your care team as soon as possible: Allergic reactions--skin rash, itching, hives, swelling of the face, lips, tongue, or throat Bleeding--bloody or black, tar-like stools, vomiting blood or brown material that looks like coffee grounds, red or dark brown urine, small red or purple spots on skin, unusual bruising or bleeding Dry cough, shortness of breath or trouble breathing Heart rhythm changes--fast or irregular heartbeat, dizziness, feeling faint or lightheaded, chest pain, trouble breathing Infection--fever, chills, cough, sore throat, wounds that don't heal, pain or trouble when passing urine, general feeling of discomfort or being unwell  Liver injury--right upper belly pain, loss of appetite, nausea, light-colored stool, dark yellow or brown urine, yellowing skin or eyes, unusual weakness or fatigue Low red blood cell level--unusual weakness or fatigue, dizziness, headache, trouble breathing Muscle injury--unusual weakness or fatigue, muscle pain, dark yellow or brown urine, decrease in amount of urine Pain, tingling, or numbness in the hands or feet Sudden and severe headache, confusion, change in vision, seizures, which may be signs of posterior reversible encephalopathy syndrome (PRES) Unusual bruising or bleeding Side effects that usually do not require medical attention (report to your care team if they continue or are  bothersome): Diarrhea Nausea Pain, redness, or swelling with sores inside the mouth or throat Unusual weakness or fatigue Vomiting This list may not describe all possible side effects. Call your doctor for medical advice about side effects. You may report side effects to FDA at 1-800-FDA-1088. Where should I keep my medication? This medication is given in a hospital or clinic. It will not be stored at home. NOTE: This sheet is a summary. It may not cover all possible information. If you have questions about this medicine, talk to your doctor, pharmacist, or health care provider.  2024 [employer]/Gold Standard (2023-03-30 00:00:00)

## 2024-02-26 ENCOUNTER — Encounter: Payer: Self-pay | Admitting: Oncology

## 2024-02-26 ENCOUNTER — Other Ambulatory Visit: Payer: Self-pay

## 2024-02-27 ENCOUNTER — Other Ambulatory Visit: Payer: Self-pay | Admitting: Hematology and Oncology

## 2024-02-27 ENCOUNTER — Inpatient Hospital Stay: Payer: Self-pay

## 2024-02-27 VITALS — BP 108/72 | HR 79 | Temp 99.0°F | Resp 18

## 2024-02-27 DIAGNOSIS — C20 Malignant neoplasm of rectum: Secondary | ICD-10-CM

## 2024-02-27 MED ORDER — PROCHLORPERAZINE MALEATE 10 MG PO TABS: 10.0000 mg | ORAL_TABLET | Freq: Four times a day (QID) | ORAL | 3 refills | Status: AC | PRN

## 2024-02-27 MED ORDER — LORAZEPAM 1 MG PO TABS: 1.0000 mg | ORAL_TABLET | Freq: Three times a day (TID) | ORAL | 0 refills | Status: DC

## 2024-02-27 NOTE — Progress Notes (Signed)
 Message left on pt's cell phone that [facility] called in prescriptions for compazine  and ativan  for nausea to [facility] on [address].  Informed that [facility] doesn't recommend the vit c infusions until after his chemo is complete.

## 2024-02-27 NOTE — Patient Instructions (Signed)
 Central Line in Adults: What to Expect A central line is a soft tube called a catheter that is put into a vein in the neck, chest, arm or groin. The tip of the central line ends in a large vein just above the heart called the vena cava. A central line may be used to: Give medicines or fluids through an IV for a long time. Give you nutrients. Take blood or give you blood for testing or treatments. Give chemotherapy or dialysis. Provide IV access if veins in your hands or arms are difficult to use. Types of central lines There are four main types of central lines: Peripherally inserted central catheter (PICC) line. A PICC line goes up the upper arm to the vena cava. This is used for access of one week or longer. It can be used to draw blood, give fluids or medicines. Tunneled central line. It's placed in a large vein in the neck, chest, or groin. It's put in through a small cut is made over the vein, and then it's advanced to the vena cava. This is used for long-term therapy and dialysis. It's tunneled under the skin and brought out through a second cut. Non-tunneled central line. This type is put in the neck, chest or groin. It's usually used for 7 days at the most. It's often used in the emergency department. Implanted port. It's usually put in the upper chest, but it can also be placed in the upper arm or the belly. This is used for long-term therapy. It can stay in place longer than other types of central lines. It's put in and taken out with surgery, and it's accessed using a needle. The type of central line you get depends on how long you will need it, your medical condition, and the condition of your veins. Tell a health care provider about: Any allergies you have. All medicines you take. These include vitamins, herbs, eye drops, and creams. Any problems you or family members have had with anesthesia. Any bleeding problems you have. Any surgeries you've had. Any medical conditions  you have. Whether you're pregnant or may be pregnant. What are the risks? Your health care provider will talk with you about risks. These may include: Infection. A blood clot that blocks the central line or forms in the vein and travels to the heart. Bleeding. Getting a hole or crack in the central line. If this happens, the central line will need to be replaced. The catheter moving or coming out of place. A collapsed lung. Damage to other structures or organs. What happens before the procedure? Medicines Ask about changing or stopping: Any medicines you take. Any vitamins, herbs, or supplements you take. Do not take aspirin or ibuprofen unless you're told to. Surgery safety For your safety, you may: Need to wash your skin with a soap that kills germs. Get antibiotics. Have your procedure site marked. Have hair removed at the procedure site. General instructions Eat and drink as told. Ask if you'll be staying overnight in the hospital. If you'll be going home right after the procedure, plan to have a responsible adult: Drive you home from the hospital or clinic. You won't be allowed to drive. Stay with you for the time you're told. What happens during the procedure? An IV will be put into one of your veins. You may be given: A sedative to help you relax. Anesthesia to keep you from feeling pain. Your skin will be cleaned with a soap that kills germs, and  you may be covered with a clean sheet called a drape. Your blood pressure, heart rate, breathing rate, and blood oxygen level will be checked during the procedure. The central line catheter will be put into the vein and moved through to the correct spot. The provider may use X-rays to make sure the catheter goes to the right place. A bandage will be placed over the insertion area. These steps may vary. Ask what you can expect. What can I expect after the procedure? You will watched closely until you leave. This includes  checking your pain level, blood pressure, heart rate, and breathing rate. Caps may be put on the ends of the central line tubes. If you were given a sedative, do not drive or use machines until you're told it's safe. A sedative can make you sleepy. Follow these instructions at home: Flushing and cleaning the central line  Follow instructions from your provider about flushing and cleaning the central line and the area around it. Only use germ-free, also called sterile, supplies to flush the central line. Use supplies recommended by your provider. Before you flush the central line or clean the central line or the area around it: Wash your hands with soap and water for at least 20 seconds. If you can't use soap and water, use hand sanitizer. Clean the central line hub with rubbing alcohol. To do this: Clean the central line hub with a new alcohol wipe. Scrub it using a twisting motion and rub for 10 to 15 seconds or for 30 twists. Follow the manufacturer's instructions. Be sure you scrub the top of the hub, not just the sides. Let the hub dry before use. Prevent it from touching anything while drying. Do not re-use alcohol pads. Caring for your skin Check your central line site every day for signs of infection. Check for: Redness, swelling, or pain. Fluid or blood. Warmth. Pus or a bad smell. Keep the insertion site of your central line clean and dry at all times. Change your bandage only as told by your provider. Keep your bandage dry. If it gets wet, have it changed as soon as possible. Storage and throwing away supplies Keep your supplies in a clean, dry location. Throw away any used syringes in a disposal container that is meant for sharp items, called a sharps container. You can buy a sharps container from a pharmacy, or you can make one by using an empty hard plastic bottle with a cover. Place any used bandages or infusion bags into a plastic bag. Throw that bag in the trash. General  instructions Follow instructions from your provider for the type of device that you have. Keep the tube clamped unless it's being used. If you or someone else accidentally pulls on the tube, make sure: The bandage is OK. There is no bleeding. The tube has not been pulled out. Do not use scissors or sharp objects near the tube. Do not take baths, swim, or use a hot tub until you're told it's OK. Ask if you can shower. Ask what things are safe for you to do. Your provider may tell you not to lift anything or move your arm too much on the side of your central line. Contact a health care provider if: You have signs of an infection where the tube was put in. The skin is irritated near the bandage. You catheter appears to be getting longer. This may mean it's coming out of the vein. Get help right away if: You have:  A fever or chills. Shortness of breath. Pain in your chest. A fast heartbeat. Swelling in your neck, face, chest, or arm on the side of your central line. You feel dizzy or you faint. Your cut or central line site has red streaks spreading away from the area. Your cut or central line site is bleeding and won't stop. Your central line is hard to flush or will not flush or you do not get a blood return. Your central line gets loose, damaged or comes out. Your catheter leaks when flushed or when fluids are infused into it. This information is not intended to replace advice given to you by your health care provider. Make sure you discuss any questions you have with your health care provider. Document Revised: 11/13/2022 Document Reviewed: 11/13/2022 Elsevier Patient Education  2024 [employer].

## 2024-03-09 NOTE — Progress Notes (Unsigned)
 [facility] at [facility] [address] [place],  [place]  [address] [phone]  Clinic Day:  03/09/2024  Referring physician: Silver Lamar LABOR, MD   HISTORY OF PRESENT ILLNESS:  The patient is a 58 y.o. male with stage IVA (T3 N2a M1a) rectal cancer, which includes 1 metastatic liver lesion.  He is currently receiving FOLFOX chemotherapy with the hope that it will shrink both his rectal cancer and his metastatic liver lesion to where both could be synchronously resected.  He comes in today to be evaluated before heading into his 5th cycle of FOLFOX chemotherapy.  The patient tolerated his 2nd cycle of FOLFOX fairly well.  However, he has begun to have more constipation.  In the past few years, diarrhea has been a major issue.  Otherwise, he is doing well and is willing to proceed with his next cycle of treatment.  PHYSICAL EXAM:  There were no vitals taken for this visit. Wt Readings from Last 3 Encounters:  02/25/24 180 lb 6.4 oz (81.8 kg)  02/11/24 170 lb 4.8 oz (77.2 kg)  01/28/24 171 lb (77.6 kg)   There is no height or weight on file to calculate BMI. Performance status (ECOG): 1 - Symptomatic but completely ambulatory Physical Exam Constitutional:      Appearance: Normal appearance. He is not ill-appearing.  HENT:     Mouth/Throat:     Mouth: Mucous membranes are moist.     Pharynx: Oropharynx is clear. No oropharyngeal exudate or posterior oropharyngeal erythema.  Cardiovascular:     Rate and Rhythm: Normal rate and regular rhythm.     Heart sounds: No murmur heard.    No friction rub. No gallop.  Pulmonary:     Effort: Pulmonary effort is normal. No respiratory distress.     Breath sounds: Normal breath sounds. No wheezing, rhonchi or rales.  Abdominal:     General: Bowel sounds are normal. There is no distension.     Palpations: Abdomen is soft. There is no mass.     Tenderness: There is no abdominal tenderness.  Musculoskeletal:         General: No swelling.     Right lower leg: No edema.     Left lower leg: No edema.  Lymphadenopathy:     Cervical: No cervical adenopathy.     Upper Body:     Right upper body: No supraclavicular or axillary adenopathy.     Left upper body: No supraclavicular or axillary adenopathy.     Lower Body: No right inguinal adenopathy. No left inguinal adenopathy.  Skin:    General: Skin is warm.     Coloration: Skin is not jaundiced.     Findings: No lesion or rash.  Neurological:     General: No focal deficit present.     Mental Status: He is alert and oriented to person, place, and time. Mental status is at baseline.  Psychiatric:        Mood and Affect: Mood normal.        Behavior: Behavior normal.        Thought Content: Thought content normal.     LABS:      Latest Ref Rng & Units 02/25/2024    8:42 AM 02/11/2024    8:48 AM 01/28/2024    8:33 AM  CBC  WBC 4.0 - 10.5 K/uL 4.1  4.0  4.0   Hemoglobin 13.0 - 17.0 g/dL 86.4  85.9  86.1   Hematocrit 39.0 -  52.0 % 41.2  41.9  42.0   Platelets 150 - 400 K/uL 141  148  225       Latest Ref Rng & Units 02/25/2024    8:42 AM 02/11/2024    8:48 AM 01/28/2024    8:33 AM  CMP  Glucose 70 - 99 mg/dL 86  93  886   BUN 6 - 20 mg/dL 9  14  12    Creatinine 0.61 - 1.24 mg/dL 8.94  9.03  8.93   Sodium 135 - 145 mmol/L 141  142  142   Potassium 3.5 - 5.1 mmol/L 4.2  4.0  3.6   Chloride 98 - 111 mmol/L 106  105  104   CO2 22 - 32 mmol/L 25  25  25    Calcium  8.9 - 10.3 mg/dL 9.7  9.3  9.5   Total Protein 6.5 - 8.1 g/dL 6.6  6.8  6.9   Total Bilirubin 0.0 - 1.2 mg/dL 0.3  0.3  0.3   Alkaline Phos 38 - 126 U/L 92  91  81   AST 15 - 41 U/L 48  50  17   ALT 0 - 44 U/L 96  98  15    Lab Results  Component Value Date   CEA 32.92 (H) 02/11/2024   CEA 6.9 (H) 11/27/2023   PATHOLOGY (11-27-23): FINAL DIAGNOSIS        1. Duodenum, Biopsy,  :       - DUODENAL MUCOSA WITH NO SPECIFIC HISTOPATHOLOGIC CHANGES       - NEGATIVE FOR INCREASED  INTRAEPITHELIAL LYMPHOCYTES OR VILLOUS ARCHITECTURAL       CHANGES        2. 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 Ascending  Colon Polyp, x 1 :       - TUBULAR ADENOMA(S)       - NEGATIVE FOR HIGH-GRADE DYSPLASIA OR MALIGNANCY        4. Rectum, biopsy, circumferential apple core mass :       - INVASIVE MODERATELY DIFFERENTIATED ADENOCARCINOMA, SEE COMMENT    STUDIES: His PET scan from 12/24/2023 revealed the following:  FINDINGS: Mediastinal blood-pool activity (background): SUV max = 2.2  Liver activity (reference): SUV max = N/A  NECK: No hypermetabolic lymph nodes or masses.  Incidental CT findings: None.  CHEST: No hypermetabolic lymph nodes. No suspicious pulmonary nodules seen on CT images.  Incidental CT findings: None.  ABDOMEN/PELVIS: 5 cm low-attenuation mass near junction of right and left hepatic lobes shows peripheral hypermetabolism with central necrosis. This has SUV max of 9.6, consistent liver metastasis. No other hypermetabolic liver lesions are seen. No abnormal hypermetabolic activity within the pancreas, adrenal glands, or spleen.  Circumferential rectal mass is seen which shows hypermetabolism, with SUV max of 27.4, consistent with rectal carcinoma. Extramural vascular invasion is again seen in the left anterior perirectal region which is hypermetabolic.  Mildly hypermetabolic mesorectal lymph nodes are seen in the left lateral and posterior perirectal space and sigmoid mesocolon, with SUV max measuring up to 3.9. Largest lymph node measures 10 mm in the left perirectal space. These are consistent with local lymph node metastases. No hypermetabolic extra-mesorectal lymph nodes seen.  Incidental CT findings: 11 mm nonobstructing left renal calculus.  SKELETON: No focal hypermetabolic bone lesions to suggest skeletal metastasis.  Incidental CT findings:  None.  IMPRESSION:  Hypermetabolic circumferential rectal mass, consistent with primary rectal carcinoma.  Local lymph node metastases in the mesorectal space and sigmoid mesocolon.  5 cm hypermetabolic metastasis in the left hepatic lobe.  No evidence of metastatic disease in the neck or chest.  11 mm nonobstructing left renal calculus.  ASSESSMENT & PLAN:  A 58 y.o. male with stage IVA (T3 N2a M1a) rectal cancer.  He will proceed with his 3rd of 6 planned cycles of neoadjuvant FOLFOX chemotherapy today.  With respect to his constipation, the patient was told to use either magnesium citrate or milk of magnesia to elicit bowel movements.  He also knows to use standard laxatives on a daily basis to further promote more regular bowel movements.  Otherwise, he appears to be doing very well.  I will see him back in 2 weeks before he heads into his 4th cycle of neoadjuvant FOLFOX chemotherapy. The patient understands all the plans discussed today and is in agreement with them.  Rosette Bellavance DELENA Kerns, MD

## 2024-03-10 ENCOUNTER — Inpatient Hospital Stay: Payer: Self-pay

## 2024-03-10 ENCOUNTER — Inpatient Hospital Stay: Payer: Self-pay | Attending: Oncology

## 2024-03-10 ENCOUNTER — Inpatient Hospital Stay (HOSPITAL_BASED_OUTPATIENT_CLINIC_OR_DEPARTMENT_OTHER): Payer: Self-pay | Admitting: Oncology

## 2024-03-10 VITALS — BP 133/75 | HR 70 | Temp 98.5°F | Resp 16 | Ht 67.0 in | Wt 175.8 lb

## 2024-03-10 DIAGNOSIS — C20 Malignant neoplasm of rectum: Secondary | ICD-10-CM

## 2024-03-10 DIAGNOSIS — C787 Secondary malignant neoplasm of liver and intrahepatic bile duct: Secondary | ICD-10-CM | POA: Insufficient documentation

## 2024-03-10 DIAGNOSIS — Z5111 Encounter for antineoplastic chemotherapy: Secondary | ICD-10-CM | POA: Insufficient documentation

## 2024-03-10 DIAGNOSIS — Z79899 Other long term (current) drug therapy: Secondary | ICD-10-CM | POA: Insufficient documentation

## 2024-03-10 LAB — CBC WITH DIFFERENTIAL (CANCER CENTER ONLY)
Abs Immature Granulocytes: 0.01 K/uL (ref 0.00–0.07)
Basophils Absolute: 0 K/uL (ref 0.0–0.1)
Basophils Relative: 1 %
Eosinophils Absolute: 0.1 K/uL (ref 0.0–0.5)
Eosinophils Relative: 2 %
HCT: 38.7 % — ABNORMAL LOW (ref 39.0–52.0)
Hemoglobin: 12.9 g/dL — ABNORMAL LOW (ref 13.0–17.0)
Immature Granulocytes: 0 %
Lymphocytes Relative: 38 %
Lymphs Abs: 1.4 K/uL (ref 0.7–4.0)
MCH: 29.9 pg (ref 26.0–34.0)
MCHC: 33.3 g/dL (ref 30.0–36.0)
MCV: 89.8 fL (ref 80.0–100.0)
Monocytes Absolute: 0.5 K/uL (ref 0.1–1.0)
Monocytes Relative: 14 %
Neutro Abs: 1.7 K/uL (ref 1.7–7.7)
Neutrophils Relative %: 45 %
Platelet Count: 142 K/uL — ABNORMAL LOW (ref 150–400)
RBC: 4.31 MIL/uL (ref 4.22–5.81)
RDW: 19 % — ABNORMAL HIGH (ref 11.5–15.5)
WBC Count: 3.7 K/uL — ABNORMAL LOW (ref 4.0–10.5)
nRBC: 0 % (ref 0.0–0.2)

## 2024-03-10 LAB — CMP (CANCER CENTER ONLY)
ALT: 36 U/L (ref 0–44)
AST: 31 U/L (ref 15–41)
Albumin: 3.7 g/dL (ref 3.5–5.0)
Alkaline Phosphatase: 93 U/L (ref 38–126)
Anion gap: 11 (ref 5–15)
BUN: 10 mg/dL (ref 6–20)
CO2: 25 mmol/L (ref 22–32)
Calcium: 9.4 mg/dL (ref 8.9–10.3)
Chloride: 105 mmol/L (ref 98–111)
Creatinine: 1.05 mg/dL (ref 0.61–1.24)
GFR, Estimated: >60 mL/min (ref >60)
Glucose, Bld: 92 mg/dL (ref 70–99)
Potassium: 3.8 mmol/L (ref 3.5–5.1)
Sodium: 140 mmol/L (ref 135–145)
Total Bilirubin: 0.4 mg/dL (ref 0.0–1.2)
Total Protein: 6.5 g/dL (ref 6.5–8.1)

## 2024-03-10 MED ORDER — FLUOROURACIL CHEMO INJECTION 2.5 GM/50ML
400.0000 mg/m2 | Freq: Once | INTRAVENOUS | Status: AC
  Administered 2024-03-10: 750 mg via INTRAVENOUS
  Filled 2024-03-10: qty 15

## 2024-03-10 MED ORDER — OXALIPLATIN CHEMO INJECTION 100 MG/20ML
87.0000 mg/m2 | Freq: Once | INTRAVENOUS | Status: AC
  Administered 2024-03-10: 165 mg via INTRAVENOUS
  Filled 2024-03-10: qty 33

## 2024-03-10 MED ORDER — LEUCOVORIN CALCIUM INJECTION 350 MG
400.0000 mg/m2 | Freq: Once | INTRAVENOUS | Status: AC
  Administered 2024-03-10: 760 mg via INTRAVENOUS
  Filled 2024-03-10: qty 38

## 2024-03-10 MED ORDER — SODIUM CHLORIDE 0.9 % IV SOLN
2400.0000 mg/m2 | INTRAVENOUS | Status: DC
  Administered 2024-03-10: 5000 mg via INTRAVENOUS
  Filled 2024-03-10: qty 100

## 2024-03-10 MED ORDER — DEXTROSE 5 % IV SOLN: INTRAVENOUS | Status: DC

## 2024-03-10 MED ORDER — PALONOSETRON HCL INJECTION 0.25 MG/5ML
0.2500 mg | Freq: Once | INTRAVENOUS | Status: AC
  Administered 2024-03-10: 0.25 mg via INTRAVENOUS
  Filled 2024-03-10: qty 5

## 2024-03-10 MED ORDER — SODIUM CHLORIDE 0.9% FLUSH: 10.0000 mL | INTRAVENOUS | Status: DC | PRN

## 2024-03-10 MED ORDER — DEXAMETHASONE SOD PHOSPHATE PF 10 MG/ML IJ SOLN
10.0000 mg | Freq: Once | INTRAMUSCULAR | Status: AC
  Administered 2024-03-10: 10 mg via INTRAVENOUS

## 2024-03-10 NOTE — Patient Instructions (Signed)
 Fluorouracil Injection What is this medication? FLUOROURACIL (flure oh YOOR a sil) treats some types of cancer. It works by slowing down the growth of cancer cells. This medicine may be used for other purposes; ask your health care provider or pharmacist if you have questions. COMMON BRAND NAME(S): Adrucil What should I tell my care team before I take this medication? They need to know if you have any of these conditions: Blood disorders Dihydropyrimidine dehydrogenase (DPD) deficiency Infection, such as chickenpox, cold sores, herpes Kidney disease Liver disease Poor nutrition Recent or ongoing radiation therapy An unusual or allergic reaction to fluorouracil, other medications, foods, dyes, or preservatives If you or your partner are pregnant or trying to get pregnant Breast-feeding How should I use this medication? This medication is injected into a vein. It is administered by your care team in a hospital or clinic setting. Talk to your care team about the use of this medication in children. Special care may be needed. Overdosage: If you think you have taken too much of this medicine contact a poison control center or emergency room at once. NOTE: This medicine is only for you. Do not share this medicine with others. What if I miss a dose? Keep appointments for follow-up doses. It is important not to miss your dose. Call your care team if you are unable to keep an appointment. What may interact with this medication? Do not take this medication with any of the following: Live virus vaccines This medication may also interact with the following: Medications that treat or prevent blood clots, such as warfarin, enoxaparin, dalteparin This list may not describe all possible interactions. Give your health care provider a list of all the medicines, herbs, non-prescription drugs, or dietary supplements you use. Also tell them if you smoke, drink alcohol, or use illegal drugs. Some items may  interact with your medicine. What should I watch for while using this medication? Your condition will be monitored carefully while you are receiving this medication. This medication may make you feel generally unwell. This is not uncommon as chemotherapy can affect healthy cells as well as cancer cells. Report any side effects. Continue your course of treatment even though you feel ill unless your care team tells you to stop. In some cases, you may be given additional medications to help with side effects. Follow all directions for their use. This medication may increase your risk of getting an infection. Call your care team for advice if you get a fever, chills, sore throat, or other symptoms of a cold or flu. Do not treat yourself. Try to avoid being around people who are sick. This medication may increase your risk to bruise or bleed. Call your care team if you notice any unusual bleeding. Be careful brushing or flossing your teeth or using a toothpick because you may get an infection or bleed more easily. If you have any dental work done, tell your dentist you are receiving this medication. Avoid taking medications that contain aspirin, acetaminophen, ibuprofen, naproxen, or ketoprofen unless instructed by your care team. These medications may hide a fever. Do not treat diarrhea with over the counter products. Contact your care team if you have diarrhea that lasts more than 2 days or if it is severe and watery. This medication can make you more sensitive to the sun. Keep out of the sun. If you cannot avoid being in the sun, wear protective clothing and sunscreen. Do not use sun lamps, tanning beds, or tanning booths. Talk to  your care team if you or your partner wish to become pregnant or think you might be pregnant. This medication can cause serious birth defects if taken during pregnancy and for 3 months after the last dose. A reliable form of contraception is recommended while taking this  medication and for 3 months after the last dose. Talk to your care team about effective forms of contraception. Do not father a child while taking this medication and for 3 months after the last dose. Use a condom while having sex during this time period. Do not breastfeed while taking this medication. This medication may cause infertility. Talk to your care team if you are concerned about your fertility. What side effects may I notice from receiving this medication? Side effects that you should report to your care team as soon as possible: Allergic reactions--skin rash, itching, hives, swelling of the face, lips, tongue, or throat Heart attack--pain or tightness in the chest, shoulders, arms, or jaw, nausea, shortness of breath, cold or clammy skin, feeling faint or lightheaded Heart failure--shortness of breath, swelling of the ankles, feet, or hands, sudden weight gain, unusual weakness or fatigue Heart rhythm changes--fast or irregular heartbeat, dizziness, feeling faint or lightheaded, chest pain, trouble breathing High ammonia level--unusual weakness or fatigue, confusion, loss of appetite, nausea, vomiting, seizures Infection--fever, chills, cough, sore throat, wounds that don't heal, pain or trouble when passing urine, general feeling of discomfort or being unwell Low red blood cell level--unusual weakness or fatigue, dizziness, headache, trouble breathing Pain, tingling, or numbness in the hands or feet, muscle weakness, change in vision, confusion or trouble speaking, loss of balance or coordination, trouble walking, seizures Redness, swelling, and blistering of the skin over hands and feet Severe or prolonged diarrhea Unusual bruising or bleeding Side effects that usually do not require medical attention (report to your care team if they continue or are bothersome): Dry skin Headache Increased tears Nausea Pain, redness, or swelling with sores inside the mouth or throat Sensitivity  to light Vomiting This list may not describe all possible side effects. Call your doctor for medical advice about side effects. You may report side effects to FDA at 1-800-FDA-1088. Where should I keep my medication? This medication is given in a hospital or clinic. It will not be stored at home. NOTE: This sheet is a summary. It may not cover all possible information. If you have questions about this medicine, talk to your doctor, pharmacist, or health care provider.  2024 Else[employer] Standard (2021-08-23 00:00:00)Leucovorin Injection What is this medication? LEUCOVORIN (loo koe VOR in) prevents side effects from certain medications, such as methotrexate. It works by increasing folate levels. This helps protect healthy cells in your body. It may also be used to treat anemia caused by low levels of folate. It can also be used with fluorouracil, a type of chemotherapy, to treat colorectal cancer. It works by increasing the effects of fluorouracil in the body. This medicine may be used for other purposes; ask your health care provider or pharmacist if you have questions. What should I tell my care team before I take this medication? They need to know if you have any of these conditions: Anemia from low levels of vitamin B12 in the blood An unusual or allergic reaction to leucovorin, folic acid, other medications, foods, dyes, or preservatives Pregnant or trying to get pregnant Breastfeeding How should I use this medication? This medication is injected into a vein or a muscle. It is given by your care team in  a hospital or clinic setting. Talk to your care team about the use of this medication in children. Special care may be needed. Overdosage: If you think you have taken too much of this medicine contact a poison control center or emergency room at once. NOTE: This medicine is only for you. Do not share this medicine with others. What if I miss a dose? Keep appointments for follow-up doses.  It is important not to miss your dose. Call your care team if you are unable to keep an appointment. What may interact with this medication? Capecitabine Fluorouracil Phenobarbital Phenytoin Primidone Trimethoprim;sulfamethoxazole This list may not describe all possible interactions. Give your health care provider a list of all the medicines, herbs, non-prescription drugs, or dietary supplements you use. Also tell them if you smoke, drink alcohol, or use illegal drugs. Some items may interact with your medicine. What should I watch for while using this medication? Your condition will be monitored carefully while you are receiving this medication. This medication may increase the side effects of 5-fluorouracil. Tell your care team if you have diarrhea or mouth sores that do not get better or that get worse. What side effects may I notice from receiving this medication? Side effects that you should report to your care team as soon as possible: Allergic reactions--skin rash, itching, hives, swelling of the face, lips, tongue, or throat This list may not describe all possible side effects. Call your doctor for medical advice about side effects. You may report side effects to FDA at 1-800-FDA-1088. Where should I keep my medication? This medication is given in a hospital or clinic. It will not be stored at home. NOTE: This sheet is a summary. It may not cover all possible information. If you have questions about this medicine, talk to your doctor, pharmacist, or health care provider.  2024 [employer]/Gold Standard (2021-09-20 00:00:00)Oxaliplatin Injection What is this medication? OXALIPLATIN (ox AL i PLA tin) treats colorectal cancer. It works by slowing down the growth of cancer cells. This medicine may be used for other purposes; ask your health care provider or pharmacist if you have questions. COMMON BRAND NAME(S): Eloxatin What should I tell my care team before I take this medication? They  need to know if you have any of these conditions: Heart disease History of irregular heartbeat or rhythm Liver disease Low blood cell levels (white cells, red cells, and platelets) Lung or breathing disease, such as asthma Take medications that treat or prevent blood clots Tingling of the fingers, toes, or other nerve disorder An unusual or allergic reaction to oxaliplatin, other medications, foods, dyes, or preservatives If you or your partner are pregnant or trying to get pregnant Breast-feeding How should I use this medication? This medication is injected into a vein. It is given by your care team in a hospital or clinic setting. Talk to your care team about the use of this medication in children. Special care may be needed. Overdosage: If you think you have taken too much of this medicine contact a poison control center or emergency room at once. NOTE: This medicine is only for you. Do not share this medicine with others. What if I miss a dose? Keep appointments for follow-up doses. It is important not to miss a dose. Call your care team if you are unable to keep an appointment. What may interact with this medication? Do not take this medication with any of the following: Cisapride Dronedarone Pimozide Thioridazine This medication may also interact with the following:  Aspirin and aspirin-like medications Certain medications that treat or prevent blood clots, such as warfarin, apixaban, dabigatran, and rivaroxaban Cisplatin Cyclosporine Diuretics Medications for infection, such as acyclovir, adefovir, amphotericin B, bacitracin, cidofovir, foscarnet, ganciclovir, gentamicin, pentamidine, vancomycin NSAIDs, medications for pain and inflammation, such as ibuprofen or naproxen Other medications that cause heart rhythm changes Pamidronate Zoledronic acid This list may not describe all possible interactions. Give your health care provider a list of all the medicines, herbs,  non-prescription drugs, or dietary supplements you use. Also tell them if you smoke, drink alcohol, or use illegal drugs. Some items may interact with your medicine. What should I watch for while using this medication? Your condition will be monitored carefully while you are receiving this medication. You may need blood work while taking this medication. This medication may make you feel generally unwell. This is not uncommon as chemotherapy can affect healthy cells as well as cancer cells. Report any side effects. Continue your course of treatment even though you feel ill unless your care team tells you to stop. This medication may increase your risk of getting an infection. Call your care team for advice if you get a fever, chills, sore throat, or other symptoms of a cold or flu. Do not treat yourself. Try to avoid being around people who are sick. Avoid taking medications that contain aspirin, acetaminophen, ibuprofen, naproxen, or ketoprofen unless instructed by your care team. These medications may hide a fever. Be careful brushing or flossing your teeth or using a toothpick because you may get an infection or bleed more easily. If you have any dental work done, tell your dentist you are receiving this medication. This medication can make you more sensitive to cold. Do not drink cold drinks or use ice. Cover exposed skin before coming in contact with cold temperatures or cold objects. When out in cold weather wear warm clothing and cover your mouth and nose to warm the air that goes into your lungs. Tell your care team if you get sensitive to the cold. Talk to your care team if you or your partner are pregnant or think either of you might be pregnant. This medication can cause serious birth defects if taken during pregnancy and for 9 months after the last dose. A negative pregnancy test is required before starting this medication. A reliable form of contraception is recommended while taking this  medication and for 9 months after the last dose. Talk to your care team about effective forms of contraception. Do not father a child while taking this medication and for 6 months after the last dose. Use a condom while having sex during this time period. Do not breastfeed while taking this medication and for 3 months after the last dose. This medication may cause infertility. Talk to your care team if you are concerned about your fertility. What side effects may I notice from receiving this medication? Side effects that you should report to your care team as soon as possible: Allergic reactions--skin rash, itching, hives, swelling of the face, lips, tongue, or throat Bleeding--bloody or black, tar-like stools, vomiting blood or brown material that looks like coffee grounds, red or dark brown urine, small red or purple spots on skin, unusual bruising or bleeding Dry cough, shortness of breath or trouble breathing Heart rhythm changes--fast or irregular heartbeat, dizziness, feeling faint or lightheaded, chest pain, trouble breathing Infection--fever, chills, cough, sore throat, wounds that don't heal, pain or trouble when passing urine, general feeling of discomfort or being unwell  Liver injury--right upper belly pain, loss of appetite, nausea, light-colored stool, dark yellow or brown urine, yellowing skin or eyes, unusual weakness or fatigue Low red blood cell level--unusual weakness or fatigue, dizziness, headache, trouble breathing Muscle injury--unusual weakness or fatigue, muscle pain, dark yellow or brown urine, decrease in amount of urine Pain, tingling, or numbness in the hands or feet Sudden and severe headache, confusion, change in vision, seizures, which may be signs of posterior reversible encephalopathy syndrome (PRES) Unusual bruising or bleeding Side effects that usually do not require medical attention (report to your care team if they continue or are  bothersome): Diarrhea Nausea Pain, redness, or swelling with sores inside the mouth or throat Unusual weakness or fatigue Vomiting This list may not describe all possible side effects. Call your doctor for medical advice about side effects. You may report side effects to FDA at 1-800-FDA-1088. Where should I keep my medication? This medication is given in a hospital or clinic. It will not be stored at home. NOTE: This sheet is a summary. It may not cover all possible information. If you have questions about this medicine, talk to your doctor, pharmacist, or health care provider.  2024 [employer]/Gold Standard (2023-03-30 00:00:00)

## 2024-03-12 ENCOUNTER — Other Ambulatory Visit: Payer: Self-pay

## 2024-03-12 ENCOUNTER — Inpatient Hospital Stay: Payer: Self-pay

## 2024-03-12 NOTE — Patient Instructions (Signed)
 Managing Low Blood Counts During Chemotherapy In this video you'll learn how chemotherapy can affect your blood counts. You'll also learn what you can do to help prevent and manage this treatment side effect. To view the content, go to this web address: [email or web address]  This video will expire on: 04/11/2025. If you need access to this video following this date, please reach out to the healthcare provider who assigned it to you. This information is not intended to replace advice given to you by your health care provider. Make sure you discuss any questions you have with your health care provider. Elsevier Patient Education  2024 [employer].

## 2024-03-19 ENCOUNTER — Other Ambulatory Visit: Payer: Self-pay

## 2024-03-19 DIAGNOSIS — F17210 Nicotine dependence, cigarettes, uncomplicated: Secondary | ICD-10-CM

## 2024-03-19 MED ORDER — NICOTINE 14 MG/24HR TD PT24: MEDICATED_PATCH | TRANSDERMAL | 0 refills | Status: DC

## 2024-03-21 MED ORDER — SODIUM CHLORIDE 0.9 % IV SOLN
2400.0000 mg/m2 | INTRAVENOUS | Status: DC
  Administered 2024-03-24: 5000 mg via INTRAVENOUS
  Filled 2024-03-21: qty 100

## 2024-03-21 MED FILL — Fluorouracil IV Soln 2.5 GM/50ML (50 MG/ML): INTRAVENOUS | Qty: 15 | Status: AC

## 2024-03-23 NOTE — Progress Notes (Unsigned)
 [facility] at [facility] [address] [place],  [place]  [address] [phone]  Clinic Day:  03/24/2024  Referring physician: Silver Lamar LABOR, MD   HISTORY OF PRESENT ILLNESS:  The patient is a 58 y.o. male with stage IVA (T3 N2a M1a) rectal cancer, which includes 1 metastatic liver lesion.  He is currently receiving FOLFOX chemotherapy with the hope that it will shrink both his rectal cancer and his metastatic liver lesion to where both can be synchronously resected.  He comes in today to be evaluated before heading into his 6th cycle of FOLFOX chemotherapy.  The patient tolerated his 5th cycle of FOLFOX fairly well.  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    PHYSICAL EXAM:  Blood pressure 129/84, pulse 71, temperature 98.2 F (36.8 C), temperature source Oral, resp. rate 16, height 5' 7 (1.702 m), weight 178 lb 9.6 oz (81 kg), SpO2 98%. Wt Readings from Last 3 Encounters:  03/24/24 178 lb 1.4 oz (80.8 kg)  03/24/24 178 lb 9.6 oz (81 kg)  03/10/24 175 lb 12.8 oz (79.7 kg)   Body mass index is 27.97 kg/m. Performance status (ECOG): 1 - Symptomatic but completely ambulatory Physical Exam Constitutional:      Appearance: Normal appearance. He is not ill-appearing.  HENT:     Mouth/Throat:     Mouth: Mucous membranes are moist.     Pharynx: Oropharynx is clear. No oropharyngeal exudate or posterior oropharyngeal erythema.  Cardiovascular:     Rate and Rhythm: Normal rate and regular rhythm.     Heart sounds: No murmur heard.    No friction rub. No gallop.  Pulmonary:     Effort: Pulmonary effort is normal. No respiratory distress.     Breath sounds: Normal breath sounds. No wheezing, rhonchi or rales.  Abdominal:     General: Bowel sounds are normal. There is no distension.     Palpations: Abdomen is soft. There is no mass.     Tenderness: There is no abdominal tenderness.   Musculoskeletal:        General: No swelling.     Right lower leg: No edema.     Left lower leg: No edema.  Lymphadenopathy:     Cervical: No cervical adenopathy.     Upper Body:     Right upper body: No supraclavicular or axillary adenopathy.     Left upper body: No supraclavicular or axillary adenopathy.     Lower Body: No right inguinal adenopathy. No left inguinal adenopathy.  Skin:    General: Skin is warm.     Coloration: Skin is not jaundiced.     Findings: No lesion or rash.  Neurological:     General: No focal deficit present.     Mental Status: He is alert and oriented to person, place, and time. Mental status is at baseline.  Psychiatric:        Mood and Affect: Mood normal.        Behavior: Behavior normal.        Thought Content: Thought content normal.    LABS:      Latest Ref Rng & Units 03/24/2024    9:06 AM 03/10/2024    8:50 AM 02/25/2024    8:42 AM  CBC  WBC 4.0 - 10.5 K/uL 3.8  3.7  4.1   Hemoglobin 13.0 - 17.0 g/dL 87.1  87.0  86.4   Hematocrit 39.0 - 52.0 %  38.6  38.7  41.2   Platelets 150 - 400 K/uL 140  142  141     Latest Reference Range & Units 03/24/24 09:06  Neutrophils % 49  Lymphocytes % 35  Monocytes Relative % 13  Eosinophil % 2  Basophil % 1  Immature Granulocytes % 0  NEUT# 1.7 - 7.7 K/uL 1.9      Latest Ref Rng & Units 03/24/2024    9:06 AM 03/10/2024    8:50 AM 02/25/2024    8:42 AM  CMP  Glucose 70 - 99 mg/dL 875  92  86   BUN 6 - 20 mg/dL 14  10  9    Creatinine 0.61 - 1.24 mg/dL 9.00  8.94  8.94   Sodium 135 - 145 mmol/L 139  140  141   Potassium 3.5 - 5.1 mmol/L 3.9  3.8  4.2   Chloride 98 - 111 mmol/L 106  105  106   CO2 22 - 32 mmol/L 24  25  25    Calcium  8.9 - 10.3 mg/dL 9.4  9.4  9.7   Total Protein 6.5 - 8.1 g/dL 7.0  6.5  6.6   Total Bilirubin 0.0 - 1.2 mg/dL 0.5  0.4  0.3   Alkaline Phos 38 - 126 U/L 111  93  92   AST 15 - 41 U/L 36  31  48   ALT 0 - 44 U/L 31  36  96    Lab Results  Component Value Date    CEA 32.92 (H) 02/11/2024   CEA 6.9 (H) 11/27/2023   PATHOLOGY (11-27-23): FINAL DIAGNOSIS        1. Duodenum, Biopsy,  :       - DUODENAL MUCOSA WITH NO SPECIFIC HISTOPATHOLOGIC CHANGES       - NEGATIVE FOR INCREASED INTRAEPITHELIAL LYMPHOCYTES OR VILLOUS ARCHITECTURAL       CHANGES        2. 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 Ascending  Colon Polyp, x 1 :       - TUBULAR ADENOMA(S)       - NEGATIVE FOR HIGH-GRADE DYSPLASIA OR MALIGNANCY        4. Rectum, biopsy, circumferential apple core mass :       - INVASIVE MODERATELY DIFFERENTIATED ADENOCARCINOMA, SEE COMMENT    STUDIES: His PET scan from 12/24/2023 revealed the following:  FINDINGS: Mediastinal blood-pool activity (background): SUV max = 2.2  Liver activity (reference): SUV max = N/A  NECK: No hypermetabolic lymph nodes or masses.  Incidental CT findings: None.  CHEST: No hypermetabolic lymph nodes. No suspicious pulmonary nodules seen on CT images.  Incidental CT findings: None.  ABDOMEN/PELVIS: 5 cm low-attenuation mass near junction of right and left hepatic lobes shows peripheral hypermetabolism with central necrosis. This has SUV max of 9.6, consistent liver metastasis. No other hypermetabolic liver lesions are seen. No abnormal hypermetabolic activity within the pancreas, adrenal glands, or spleen.  Circumferential rectal mass is seen which shows hypermetabolism, with SUV max of 27.4, consistent with rectal carcinoma. Extramural vascular invasion is again seen in the left anterior perirectal region which is hypermetabolic.  Mildly hypermetabolic mesorectal lymph nodes are seen in the left lateral and posterior perirectal space and sigmoid mesocolon, with SUV max measuring up to 3.9. Largest lymph node measures 10 mm in the left perirectal space. These are  consistent with local lymph node metastases. No hypermetabolic  extra-mesorectal lymph nodes seen.  Incidental CT findings: 11 mm nonobstructing left renal calculus.  SKELETON: No focal hypermetabolic bone lesions to suggest skeletal metastasis.  Incidental CT findings: None.  IMPRESSION: Hypermetabolic circumferential rectal mass, consistent with primary rectal carcinoma.  Local lymph node metastases in the mesorectal space and sigmoid mesocolon.  5 cm hypermetabolic metastasis in the left hepatic lobe.  No evidence of metastatic disease in the neck or chest.  11 mm nonobstructing left renal calculus.  ASSESSMENT & PLAN:  A 58 y.o. male with stage IVA (T3 N2a M1a) rectal cancer.  He will proceed with his 6th and final cycle of neoadjuvant FOLFOX chemotherapy today.  Clinically, he appears to be doing well.   I will see him back in 2 weeks for repeat clinical assessment.  A PET scan will be done before his next visit to ascertain his new disease baseline after all 6 cycles of neoadjuvant FOLFOX chemotherapy. The patient understands all the plans discussed today and is in agreement with them.  Kennidi Yoshida DELENA Kerns, MD

## 2024-03-24 ENCOUNTER — Inpatient Hospital Stay (HOSPITAL_BASED_OUTPATIENT_CLINIC_OR_DEPARTMENT_OTHER): Payer: Self-pay | Admitting: Oncology

## 2024-03-24 ENCOUNTER — Other Ambulatory Visit: Payer: Self-pay | Admitting: Oncology

## 2024-03-24 ENCOUNTER — Inpatient Hospital Stay: Payer: Self-pay

## 2024-03-24 VITALS — BP 129/84 | HR 71 | Temp 98.2°F | Resp 18 | Ht 67.0 in | Wt 178.1 lb

## 2024-03-24 VITALS — BP 129/84 | HR 71 | Temp 98.2°F | Resp 16 | Ht 67.0 in | Wt 178.6 lb

## 2024-03-24 DIAGNOSIS — C787 Secondary malignant neoplasm of liver and intrahepatic bile duct: Secondary | ICD-10-CM

## 2024-03-24 DIAGNOSIS — C20 Malignant neoplasm of rectum: Secondary | ICD-10-CM

## 2024-03-24 LAB — CBC WITH DIFFERENTIAL (CANCER CENTER ONLY)
Abs Immature Granulocytes: 0.01 K/uL (ref 0.00–0.07)
Basophils Absolute: 0 K/uL (ref 0.0–0.1)
Basophils Relative: 1 %
Eosinophils Absolute: 0.1 K/uL (ref 0.0–0.5)
Eosinophils Relative: 2 %
HCT: 38.6 % — ABNORMAL LOW (ref 39.0–52.0)
Hemoglobin: 12.8 g/dL — ABNORMAL LOW (ref 13.0–17.0)
Immature Granulocytes: 0 %
Lymphocytes Relative: 35 %
Lymphs Abs: 1.3 K/uL (ref 0.7–4.0)
MCH: 30.6 pg (ref 26.0–34.0)
MCHC: 33.2 g/dL (ref 30.0–36.0)
MCV: 92.3 fL (ref 80.0–100.0)
Monocytes Absolute: 0.5 K/uL (ref 0.1–1.0)
Monocytes Relative: 13 %
Neutro Abs: 1.9 K/uL (ref 1.7–7.7)
Neutrophils Relative %: 49 %
Platelet Count: 140 K/uL — ABNORMAL LOW (ref 150–400)
RBC: 4.18 MIL/uL — ABNORMAL LOW (ref 4.22–5.81)
RDW: 20.5 % — ABNORMAL HIGH (ref 11.5–15.5)
WBC Count: 3.8 K/uL — ABNORMAL LOW (ref 4.0–10.5)
nRBC: 0 % (ref 0.0–0.2)

## 2024-03-24 LAB — CMP (CANCER CENTER ONLY)
ALT: 31 U/L (ref 0–44)
AST: 36 U/L (ref 15–41)
Albumin: 3.7 g/dL (ref 3.5–5.0)
Alkaline Phosphatase: 111 U/L (ref 38–126)
Anion gap: 9 (ref 5–15)
BUN: 14 mg/dL (ref 6–20)
CO2: 24 mmol/L (ref 22–32)
Calcium: 9.4 mg/dL (ref 8.9–10.3)
Chloride: 106 mmol/L (ref 98–111)
Creatinine: 0.99 mg/dL (ref 0.61–1.24)
GFR, Estimated: >60 mL/min (ref >60)
Glucose, Bld: 124 mg/dL — ABNORMAL HIGH (ref 70–99)
Potassium: 3.9 mmol/L (ref 3.5–5.1)
Sodium: 139 mmol/L (ref 135–145)
Total Bilirubin: 0.5 mg/dL (ref 0.0–1.2)
Total Protein: 7 g/dL (ref 6.5–8.1)

## 2024-03-24 MED ORDER — PALONOSETRON HCL INJECTION 0.25 MG/5ML
0.2500 mg | Freq: Once | INTRAVENOUS | Status: AC
  Administered 2024-03-24: 0.25 mg via INTRAVENOUS
  Filled 2024-03-24: qty 5

## 2024-03-24 MED ORDER — OXALIPLATIN CHEMO INJECTION 100 MG/20ML
87.0000 mg/m2 | Freq: Once | INTRAVENOUS | Status: AC
  Administered 2024-03-24: 165 mg via INTRAVENOUS
  Filled 2024-03-24: qty 33

## 2024-03-24 MED ORDER — LEUCOVORIN CALCIUM INJECTION 350 MG
400.0000 mg/m2 | Freq: Once | INTRAVENOUS | Status: AC
  Administered 2024-03-24: 760 mg via INTRAVENOUS
  Filled 2024-03-24: qty 38

## 2024-03-24 MED ORDER — DEXTROSE 5 % IV SOLN: INTRAVENOUS | Status: DC

## 2024-03-24 MED ORDER — DEXAMETHASONE SOD PHOSPHATE PF 10 MG/ML IJ SOLN
10.0000 mg | Freq: Once | INTRAMUSCULAR | Status: AC
  Administered 2024-03-24: 10 mg via INTRAVENOUS

## 2024-03-24 MED ORDER — FLUOROURACIL CHEMO INJECTION 2.5 GM/50ML
400.0000 mg/m2 | Freq: Once | INTRAVENOUS | Status: AC
  Administered 2024-03-24: 750 mg via INTRAVENOUS
  Filled 2024-03-24: qty 15

## 2024-03-24 NOTE — Patient Instructions (Signed)
 Fluorouracil Injection What is this medication? FLUOROURACIL (flure oh YOOR a sil) treats some types of cancer. It works by slowing down the growth of cancer cells. This medicine may be used for other purposes; ask your health care provider or pharmacist if you have questions. COMMON BRAND NAME(S): Adrucil What should I tell my care team before I take this medication? They need to know if you have any of these conditions: Blood disorders Dihydropyrimidine dehydrogenase (DPD) deficiency Infection, such as chickenpox, cold sores, herpes Kidney disease Liver disease Poor nutrition Recent or ongoing radiation therapy An unusual or allergic reaction to fluorouracil, other medications, foods, dyes, or preservatives If you or your partner are pregnant or trying to get pregnant Breast-feeding How should I use this medication? This medication is injected into a vein. It is administered by your care team in a hospital or clinic setting. Talk to your care team about the use of this medication in children. Special care may be needed. Overdosage: If you think you have taken too much of this medicine contact a poison control center or emergency room at once. NOTE: This medicine is only for you. Do not share this medicine with others. What if I miss a dose? Keep appointments for follow-up doses. It is important not to miss your dose. Call your care team if you are unable to keep an appointment. What may interact with this medication? Do not take this medication with any of the following: Live virus vaccines This medication may also interact with the following: Medications that treat or prevent blood clots, such as warfarin, enoxaparin, dalteparin This list may not describe all possible interactions. Give your health care provider a list of all the medicines, herbs, non-prescription drugs, or dietary supplements you use. Also tell them if you smoke, drink alcohol, or use illegal drugs. Some items may  interact with your medicine. What should I watch for while using this medication? Your condition will be monitored carefully while you are receiving this medication. This medication may make you feel generally unwell. This is not uncommon as chemotherapy can affect healthy cells as well as cancer cells. Report any side effects. Continue your course of treatment even though you feel ill unless your care team tells you to stop. In some cases, you may be given additional medications to help with side effects. Follow all directions for their use. This medication may increase your risk of getting an infection. Call your care team for advice if you get a fever, chills, sore throat, or other symptoms of a cold or flu. Do not treat yourself. Try to avoid being around people who are sick. This medication may increase your risk to bruise or bleed. Call your care team if you notice any unusual bleeding. Be careful brushing or flossing your teeth or using a toothpick because you may get an infection or bleed more easily. If you have any dental work done, tell your dentist you are receiving this medication. Avoid taking medications that contain aspirin, acetaminophen, ibuprofen, naproxen, or ketoprofen unless instructed by your care team. These medications may hide a fever. Do not treat diarrhea with over the counter products. Contact your care team if you have diarrhea that lasts more than 2 days or if it is severe and watery. This medication can make you more sensitive to the sun. Keep out of the sun. If you cannot avoid being in the sun, wear protective clothing and sunscreen. Do not use sun lamps, tanning beds, or tanning booths. Talk to  your care team if you or your partner wish to become pregnant or think you might be pregnant. This medication can cause serious birth defects if taken during pregnancy and for 3 months after the last dose. A reliable form of contraception is recommended while taking this  medication and for 3 months after the last dose. Talk to your care team about effective forms of contraception. Do not father a child while taking this medication and for 3 months after the last dose. Use a condom while having sex during this time period. Do not breastfeed while taking this medication. This medication may cause infertility. Talk to your care team if you are concerned about your fertility. What side effects may I notice from receiving this medication? Side effects that you should report to your care team as soon as possible: Allergic reactions--skin rash, itching, hives, swelling of the face, lips, tongue, or throat Heart attack--pain or tightness in the chest, shoulders, arms, or jaw, nausea, shortness of breath, cold or clammy skin, feeling faint or lightheaded Heart failure--shortness of breath, swelling of the ankles, feet, or hands, sudden weight gain, unusual weakness or fatigue Heart rhythm changes--fast or irregular heartbeat, dizziness, feeling faint or lightheaded, chest pain, trouble breathing High ammonia level--unusual weakness or fatigue, confusion, loss of appetite, nausea, vomiting, seizures Infection--fever, chills, cough, sore throat, wounds that don't heal, pain or trouble when passing urine, general feeling of discomfort or being unwell Low red blood cell level--unusual weakness or fatigue, dizziness, headache, trouble breathing Pain, tingling, or numbness in the hands or feet, muscle weakness, change in vision, confusion or trouble speaking, loss of balance or coordination, trouble walking, seizures Redness, swelling, and blistering of the skin over hands and feet Severe or prolonged diarrhea Unusual bruising or bleeding Side effects that usually do not require medical attention (report to your care team if they continue or are bothersome): Dry skin Headache Increased tears Nausea Pain, redness, or swelling with sores inside the mouth or throat Sensitivity  to light Vomiting This list may not describe all possible side effects. Call your doctor for medical advice about side effects. You may report side effects to FDA at 1-800-FDA-1088. Where should I keep my medication? This medication is given in a hospital or clinic. It will not be stored at home. NOTE: This sheet is a summary. It may not cover all possible information. If you have questions about this medicine, talk to your doctor, pharmacist, or health care provider.  2024 Else[employer] Standard (2021-08-23 00:00:00)Leucovorin Injection What is this medication? LEUCOVORIN (loo koe VOR in) prevents side effects from certain medications, such as methotrexate. It works by increasing folate levels. This helps protect healthy cells in your body. It may also be used to treat anemia caused by low levels of folate. It can also be used with fluorouracil, a type of chemotherapy, to treat colorectal cancer. It works by increasing the effects of fluorouracil in the body. This medicine may be used for other purposes; ask your health care provider or pharmacist if you have questions. What should I tell my care team before I take this medication? They need to know if you have any of these conditions: Anemia from low levels of vitamin B12 in the blood An unusual or allergic reaction to leucovorin, folic acid, other medications, foods, dyes, or preservatives Pregnant or trying to get pregnant Breastfeeding How should I use this medication? This medication is injected into a vein or a muscle. It is given by your care team in  a hospital or clinic setting. Talk to your care team about the use of this medication in children. Special care may be needed. Overdosage: If you think you have taken too much of this medicine contact a poison control center or emergency room at once. NOTE: This medicine is only for you. Do not share this medicine with others. What if I miss a dose? Keep appointments for follow-up doses.  It is important not to miss your dose. Call your care team if you are unable to keep an appointment. What may interact with this medication? Capecitabine Fluorouracil Phenobarbital Phenytoin Primidone Trimethoprim;sulfamethoxazole This list may not describe all possible interactions. Give your health care provider a list of all the medicines, herbs, non-prescription drugs, or dietary supplements you use. Also tell them if you smoke, drink alcohol, or use illegal drugs. Some items may interact with your medicine. What should I watch for while using this medication? Your condition will be monitored carefully while you are receiving this medication. This medication may increase the side effects of 5-fluorouracil. Tell your care team if you have diarrhea or mouth sores that do not get better or that get worse. What side effects may I notice from receiving this medication? Side effects that you should report to your care team as soon as possible: Allergic reactions--skin rash, itching, hives, swelling of the face, lips, tongue, or throat This list may not describe all possible side effects. Call your doctor for medical advice about side effects. You may report side effects to FDA at 1-800-FDA-1088. Where should I keep my medication? This medication is given in a hospital or clinic. It will not be stored at home. NOTE: This sheet is a summary. It may not cover all possible information. If you have questions about this medicine, talk to your doctor, pharmacist, or health care provider.  2024 [employer]/Gold Standard (2021-09-20 00:00:00)Oxaliplatin Injection What is this medication? OXALIPLATIN (ox AL i PLA tin) treats colorectal cancer. It works by slowing down the growth of cancer cells. This medicine may be used for other purposes; ask your health care provider or pharmacist if you have questions. COMMON BRAND NAME(S): Eloxatin What should I tell my care team before I take this medication? They  need to know if you have any of these conditions: Heart disease History of irregular heartbeat or rhythm Liver disease Low blood cell levels (white cells, red cells, and platelets) Lung or breathing disease, such as asthma Take medications that treat or prevent blood clots Tingling of the fingers, toes, or other nerve disorder An unusual or allergic reaction to oxaliplatin, other medications, foods, dyes, or preservatives If you or your partner are pregnant or trying to get pregnant Breast-feeding How should I use this medication? This medication is injected into a vein. It is given by your care team in a hospital or clinic setting. Talk to your care team about the use of this medication in children. Special care may be needed. Overdosage: If you think you have taken too much of this medicine contact a poison control center or emergency room at once. NOTE: This medicine is only for you. Do not share this medicine with others. What if I miss a dose? Keep appointments for follow-up doses. It is important not to miss a dose. Call your care team if you are unable to keep an appointment. What may interact with this medication? Do not take this medication with any of the following: Cisapride Dronedarone Pimozide Thioridazine This medication may also interact with the following:  Aspirin and aspirin-like medications Certain medications that treat or prevent blood clots, such as warfarin, apixaban, dabigatran, and rivaroxaban Cisplatin Cyclosporine Diuretics Medications for infection, such as acyclovir, adefovir, amphotericin B, bacitracin, cidofovir, foscarnet, ganciclovir, gentamicin, pentamidine, vancomycin NSAIDs, medications for pain and inflammation, such as ibuprofen or naproxen Other medications that cause heart rhythm changes Pamidronate Zoledronic acid This list may not describe all possible interactions. Give your health care provider a list of all the medicines, herbs,  non-prescription drugs, or dietary supplements you use. Also tell them if you smoke, drink alcohol, or use illegal drugs. Some items may interact with your medicine. What should I watch for while using this medication? Your condition will be monitored carefully while you are receiving this medication. You may need blood work while taking this medication. This medication may make you feel generally unwell. This is not uncommon as chemotherapy can affect healthy cells as well as cancer cells. Report any side effects. Continue your course of treatment even though you feel ill unless your care team tells you to stop. This medication may increase your risk of getting an infection. Call your care team for advice if you get a fever, chills, sore throat, or other symptoms of a cold or flu. Do not treat yourself. Try to avoid being around people who are sick. Avoid taking medications that contain aspirin, acetaminophen, ibuprofen, naproxen, or ketoprofen unless instructed by your care team. These medications may hide a fever. Be careful brushing or flossing your teeth or using a toothpick because you may get an infection or bleed more easily. If you have any dental work done, tell your dentist you are receiving this medication. This medication can make you more sensitive to cold. Do not drink cold drinks or use ice. Cover exposed skin before coming in contact with cold temperatures or cold objects. When out in cold weather wear warm clothing and cover your mouth and nose to warm the air that goes into your lungs. Tell your care team if you get sensitive to the cold. Talk to your care team if you or your partner are pregnant or think either of you might be pregnant. This medication can cause serious birth defects if taken during pregnancy and for 9 months after the last dose. A negative pregnancy test is required before starting this medication. A reliable form of contraception is recommended while taking this  medication and for 9 months after the last dose. Talk to your care team about effective forms of contraception. Do not father a child while taking this medication and for 6 months after the last dose. Use a condom while having sex during this time period. Do not breastfeed while taking this medication and for 3 months after the last dose. This medication may cause infertility. Talk to your care team if you are concerned about your fertility. What side effects may I notice from receiving this medication? Side effects that you should report to your care team as soon as possible: Allergic reactions--skin rash, itching, hives, swelling of the face, lips, tongue, or throat Bleeding--bloody or black, tar-like stools, vomiting blood or brown material that looks like coffee grounds, red or dark brown urine, small red or purple spots on skin, unusual bruising or bleeding Dry cough, shortness of breath or trouble breathing Heart rhythm changes--fast or irregular heartbeat, dizziness, feeling faint or lightheaded, chest pain, trouble breathing Infection--fever, chills, cough, sore throat, wounds that don't heal, pain or trouble when passing urine, general feeling of discomfort or being unwell  Liver injury--right upper belly pain, loss of appetite, nausea, light-colored stool, dark yellow or brown urine, yellowing skin or eyes, unusual weakness or fatigue Low red blood cell level--unusual weakness or fatigue, dizziness, headache, trouble breathing Muscle injury--unusual weakness or fatigue, muscle pain, dark yellow or brown urine, decrease in amount of urine Pain, tingling, or numbness in the hands or feet Sudden and severe headache, confusion, change in vision, seizures, which may be signs of posterior reversible encephalopathy syndrome (PRES) Unusual bruising or bleeding Side effects that usually do not require medical attention (report to your care team if they continue or are  bothersome): Diarrhea Nausea Pain, redness, or swelling with sores inside the mouth or throat Unusual weakness or fatigue Vomiting This list may not describe all possible side effects. Call your doctor for medical advice about side effects. You may report side effects to FDA at 1-800-FDA-1088. Where should I keep my medication? This medication is given in a hospital or clinic. It will not be stored at home. NOTE: This sheet is a summary. It may not cover all possible information. If you have questions about this medicine, talk to your doctor, pharmacist, or health care provider.  2024 [employer]/Gold Standard (2023-03-30 00:00:00)

## 2024-03-25 ENCOUNTER — Encounter: Payer: Self-pay | Admitting: Oncology

## 2024-03-26 ENCOUNTER — Telehealth: Payer: Self-pay | Admitting: Pharmacy Technician

## 2024-03-26 ENCOUNTER — Other Ambulatory Visit: Payer: Self-pay

## 2024-03-26 ENCOUNTER — Inpatient Hospital Stay: Payer: Self-pay

## 2024-03-26 NOTE — Patient Instructions (Signed)
 [facility] - A DEPT OF [place]  Discharge Instructions: Thank you for choosing [facility] to provide your oncology and hematology care.  If you have a lab appointment with the Cancer Center, please go directly to the Cancer Center and check in at the registration area.   Wear comfortable clothing and clothing appropriate for easy access to any Portacath or PICC line.   We strive to give you quality time with your provider. You may need to reschedule your appointment if you arrive late (15 or more minutes).  Arriving late affects you and other patients whose appointments are after yours.  Also, if you miss three or more appointments without notifying the office, you may be dismissed from the clinic at the provider's discretion.      For prescription refill requests, have your pharmacy contact our office and allow 72 hours for refills to be completed.    Today you received the following chemotherapy and/or immunotherapy agents    To help prevent nausea and vomiting after your treatment, we encourage you to take your nausea medication as directed.  BELOW ARE SYMPTOMS THAT SHOULD BE REPORTED IMMEDIATELY: *FEVER GREATER THAN 100.4 F (38 C) OR HIGHER *CHILLS OR SWEATING *NAUSEA AND VOMITING THAT IS NOT CONTROLLED WITH YOUR NAUSEA MEDICATION *UNUSUAL SHORTNESS OF BREATH *UNUSUAL BRUISING OR BLEEDING *URINARY PROBLEMS (pain or burning when urinating, or frequent urination) *BOWEL PROBLEMS (unusual diarrhea, constipation, pain near the anus) TENDERNESS IN MOUTH AND THROAT WITH OR WITHOUT PRESENCE OF ULCERS (sore throat, sores in mouth, or a toothache) UNUSUAL RASH, SWELLING OR PAIN  UNUSUAL VAGINAL DISCHARGE OR ITCHING   Items with * indicate a potential emergency and should be followed up as soon as possible or go to the Emergency Department if any problems should occur.  Please show the CHEMOTHERAPY ALERT CARD or IMMUNOTHERAPY ALERT CARD at  check-in to the Emergency Department and triage nurse.  Should you have questions after your visit or need to cancel or reschedule your appointment, please contact [facility]  - A DEPT OF [facility]  Dept: [phone]  and follow the prompts.  Office hours are 8:00 a.m. to 4:30 p.m. Monday - Friday. Please note that voicemails left after 4:00 p.m. may not be returned until the following business day.  We are closed weekends and major holidays. You have access to a nurse at all times for urgent questions. Please call the main number to the clinic Dept: [phone] and follow the prompts.  For any non-urgent questions, you may also contact your provider using MyChart. We now offer e-Visits for anyone 55 and older to request care online for non-urgent symptoms. For details visit [email or web address].   Also download the MyChart app! Go to the app store, search MyChart, open the app, select [facility], and log in with your MyChart username and password.

## 2024-03-26 NOTE — Telephone Encounter (Signed)
 I spoke with spouse of patient regarding applying for [facility] financial assistance.  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  [facility] financial assistance requires a non-filing status letter and spouse stated that they did not want to ask the IRS for that.  I have reached out to the financial assistance team to see if there is a workaround that issue and to find out what documentation they would accept to verify household income since a tax return cannot be provided.  I told spouse that I would let her know once I talk with the financial assistance team.  Spouse indicated that they have applied for financial assistance at [facility], and they do not ask for as much financial documentation as [facility].  If approved for 100% financial assistance at [facility], then they will want the PET Scan done there instead of [facility].  Dickey DOROTHA Fritter Patient Pharmacologist [facility]

## 2024-03-31 ENCOUNTER — Inpatient Hospital Stay: Payer: Self-pay | Attending: Oncology

## 2024-03-31 DIAGNOSIS — C787 Secondary malignant neoplasm of liver and intrahepatic bile duct: Secondary | ICD-10-CM | POA: Insufficient documentation

## 2024-03-31 DIAGNOSIS — C20 Malignant neoplasm of rectum: Secondary | ICD-10-CM | POA: Insufficient documentation

## 2024-03-31 NOTE — Progress Notes (Signed)
 [facility] CSW Progress Note  Patient requested contact be made by [facility] staff to discuss financial assistance. Patient received contact from Patient Pharmacologist. CSW followed up on this date as requested. Patient's spouse confirmed understanding of process for applying for financial assistance. Spouse will submit [facility] Application directly to [facility] hospital. Spouse understands patient services navigator can assist if needed. No other needs at this time.    Lizbeth Sprague, LCSW Clinical Social Worker [facility]

## 2024-04-03 ENCOUNTER — Telehealth: Payer: Self-pay

## 2024-04-03 ENCOUNTER — Other Ambulatory Visit: Payer: Self-pay | Admitting: Hematology and Oncology

## 2024-04-03 ENCOUNTER — Other Ambulatory Visit: Payer: Self-pay

## 2024-04-03 ENCOUNTER — Inpatient Hospital Stay: Payer: Self-pay | Admitting: Hematology and Oncology

## 2024-04-03 ENCOUNTER — Encounter: Payer: Self-pay | Admitting: Hematology and Oncology

## 2024-04-03 ENCOUNTER — Encounter: Payer: Self-pay | Admitting: Oncology

## 2024-04-03 ENCOUNTER — Inpatient Hospital Stay: Payer: Self-pay | Attending: Oncology

## 2024-04-03 VITALS — BP 119/72 | HR 95 | Temp 100.0°F | Resp 16

## 2024-04-03 DIAGNOSIS — C20 Malignant neoplasm of rectum: Secondary | ICD-10-CM

## 2024-04-03 DIAGNOSIS — R509 Fever, unspecified: Secondary | ICD-10-CM

## 2024-04-03 DIAGNOSIS — R051 Acute cough: Secondary | ICD-10-CM

## 2024-04-03 LAB — CMP (CANCER CENTER ONLY)
ALT: 31 U/L (ref 0–44)
AST: 42 U/L — ABNORMAL HIGH (ref 15–41)
Albumin: 3.5 g/dL (ref 3.5–5.0)
Alkaline Phosphatase: 94 U/L (ref 38–126)
Anion gap: 9 (ref 5–15)
BUN: 10 mg/dL (ref 6–20)
CO2: 23 mmol/L (ref 22–32)
Calcium: 9.2 mg/dL (ref 8.9–10.3)
Chloride: 104 mmol/L (ref 98–111)
Creatinine: 1.2 mg/dL (ref 0.61–1.24)
GFR, Estimated: >60 mL/min (ref >60)
Glucose, Bld: 118 mg/dL — ABNORMAL HIGH (ref 70–99)
Potassium: 3.3 mmol/L — ABNORMAL LOW (ref 3.5–5.1)
Sodium: 137 mmol/L (ref 135–145)
Total Bilirubin: 0.4 mg/dL (ref 0.0–1.2)
Total Protein: 6.6 g/dL (ref 6.5–8.1)

## 2024-04-03 LAB — RESP PANEL BY RT-PCR (RSV, FLU A&B, COVID)  RVPGX2
Influenza A by PCR: POSITIVE — AB
Influenza B by PCR: NEGATIVE
Resp Syncytial Virus by PCR: NEGATIVE
SARS Coronavirus 2 by RT PCR: NEGATIVE

## 2024-04-03 LAB — CBC WITH DIFFERENTIAL (CANCER CENTER ONLY)
Abs Immature Granulocytes: 0.01 K/uL (ref 0.00–0.07)
Basophils Absolute: 0 K/uL (ref 0.0–0.1)
Basophils Relative: 1 %
Eosinophils Absolute: 0 K/uL (ref 0.0–0.5)
Eosinophils Relative: 2 %
HCT: 35.4 % — ABNORMAL LOW (ref 39.0–52.0)
Hemoglobin: 11.7 g/dL — ABNORMAL LOW (ref 13.0–17.0)
Immature Granulocytes: 1 %
Lymphocytes Relative: 21 %
Lymphs Abs: 0.4 K/uL — ABNORMAL LOW (ref 0.7–4.0)
MCH: 31.1 pg (ref 26.0–34.0)
MCHC: 33.1 g/dL (ref 30.0–36.0)
MCV: 94.1 fL (ref 80.0–100.0)
Monocytes Absolute: 0.4 K/uL (ref 0.1–1.0)
Monocytes Relative: 19 %
Neutro Abs: 1.2 K/uL — ABNORMAL LOW (ref 1.7–7.7)
Neutrophils Relative %: 56 %
Platelet Count: 126 K/uL — ABNORMAL LOW (ref 150–400)
RBC: 3.76 MIL/uL — ABNORMAL LOW (ref 4.22–5.81)
RDW: 20.9 % — ABNORMAL HIGH (ref 11.5–15.5)
WBC Count: 2.1 K/uL — ABNORMAL LOW (ref 4.0–10.5)
nRBC: 0 % (ref 0.0–0.2)

## 2024-04-03 MED ORDER — OSELTAMIVIR PHOSPHATE 75 MG PO CAPS: 75.0000 mg | ORAL_CAPSULE | Freq: Two times a day (BID) | ORAL | 0 refills | Status: DC

## 2024-04-03 MED ORDER — AZITHROMYCIN 500 MG PO TABS: 500.0000 mg | ORAL_TABLET | Freq: Every day | ORAL | 0 refills | Status: DC

## 2024-04-03 NOTE — Telephone Encounter (Signed)
 Andrez spoke with Dr Ezzard patient to come in now and see Andrez Foy PA. Patient aware and voiced understanding will wear a mask patient and family member.

## 2024-04-03 NOTE — Progress Notes (Cosign Needed)
 [facility], [facility], [facility]  [address] [place],  [place]  [address] [phone]  Clinic Day:  04/03/2024  Referring physician: Silver Lamar LABOR, MD   HISTORY OF PRESENT ILLNESS:  The patient is a 58 y.o. male with stage IVA (T3 N2a M1a) rectal cancer, which includes 1 metastatic liver lesion.  He is currently receiving FOLFOX chemotherapy with the hope that it will shrink both his rectal cancer and his metastatic liver lesion to where both can be synchronously resected.  He received his 6th cycle of FOLFOX chemotherapy last week.  He is added to the schedule today as he telephoned reporting fever since last night, Tmax 100.6 today, body aches, headache, and dry cough.  He has been taking Tylenol, using Afrin, and alternating Cheratussin AC, Promethazine DM and guaifenesin codeine for the cough.  He has inhalers at home, but has not tried them.  He states he tends to get bronchitis easily.  He would prefer not to undergo chest x-ray.  VITALS:   Blood pressure 119/72, pulse 95, temperature 100 F (37.8 C), temperature source Oral, resp. rate 16, SpO2 99%. Wt Readings from Last 3 Encounters:  03/24/24 178 lb 1.4 oz (80.8 kg)  03/24/24 178 lb 9.6 oz (81 kg)  03/10/24 175 lb 12.8 oz (79.7 kg)   There is no height or weight on file to calculate BMI.  Performance status (ECOG): 1 - Symptomatic but completely ambulatory  PHYSICAL EXAM:   Physical Exam Vitals and nursing note reviewed.  Constitutional:      General: He is not in acute distress.    Appearance: Normal appearance. He is normal weight. He is not ill-appearing.  HENT:     Head: Normocephalic and atraumatic.     Mouth/Throat:     Mouth: Mucous membranes are moist.     Pharynx: Oropharynx is clear. No oropharyngeal exudate or posterior oropharyngeal erythema.  Eyes:     General: No scleral icterus.    Extraocular Movements: Extraocular movements intact.     Conjunctiva/sclera: Conjunctivae normal.     Pupils:  Pupils are equal, round, and reactive to light.  Cardiovascular:     Rate and Rhythm: Normal rate and regular rhythm.     Heart sounds: Normal heart sounds. No murmur heard.    No friction rub. No gallop.  Pulmonary:     Effort: Pulmonary effort is normal.     Breath sounds: Normal breath sounds. No wheezing, rhonchi or rales.  Abdominal:     General: Bowel sounds are normal. There is no distension.     Palpations: Abdomen is soft.     Tenderness: There is no abdominal tenderness.  Musculoskeletal:        General: Normal range of motion.     Cervical back: Normal range of motion and neck supple. No tenderness.     Right lower leg: No edema.     Left lower leg: No edema.  Lymphadenopathy:     Cervical: No cervical adenopathy.  Skin:    General: Skin is warm and dry.     Coloration: Skin is not jaundiced.  Neurological:     Mental Status: He is alert and oriented to person, place, and time.     Cranial Nerves: No cranial nerve deficit.  Psychiatric:        Mood and Affect: Mood normal.        Behavior: Behavior normal.        Thought Content: Thought content normal.  LABS:      Latest Ref Rng & Units 04/03/2024   11:24 AM 03/24/2024    9:06 AM 03/10/2024    8:50 AM  CBC  WBC 4.0 - 10.5 K/uL 2.1  3.8  3.7   Hemoglobin 13.0 - 17.0 g/dL 88.2  87.1  87.0   Hematocrit 39.0 - 52.0 % 35.4  38.6  38.7   Platelets 150 - 400 K/uL 126  140  142       Latest Ref Rng & Units 03/24/2024    9:06 AM 03/10/2024    8:50 AM 02/25/2024    8:42 AM  CMP  Glucose 70 - 99 mg/dL 875  92  86   BUN 6 - 20 mg/dL 14  10  9    Creatinine 0.61 - 1.24 mg/dL 9.00  8.94  8.94   Sodium 135 - 145 mmol/L 139  140  141   Potassium 3.5 - 5.1 mmol/L 3.9  3.8  4.2   Chloride 98 - 111 mmol/L 106  105  106   CO2 22 - 32 mmol/L 24  25  25    Calcium  8.9 - 10.3 mg/dL 9.4  9.4  9.7   Total Protein 6.5 - 8.1 g/dL 7.0  6.5  6.6   Total Bilirubin 0.0 - 1.2 mg/dL 0.5  0.4  0.3   Alkaline Phos 38 - 126 U/L  111  93  92   AST 15 - 41 U/L 36  31  48   ALT 0 - 44 U/L 31  36  96      Lab Results  Component Value Date   CEA 32.92 (H) 02/11/2024   CEA 6.9 (H) 11/27/2023   /  CEA ([facility])  Date Value Ref Range Status  02/11/2024 32.92 (H) 0.00 - 5.00 ng/mL Final    Comment:    (NOTE) This test was performed using [employer] Coulter's paramagnetic chemiluminescent immunoassay. Values obtained from different assay methods cannot be used interchangeably. Please note that up to 8% of patients who smoke may see values 5.1-10.0 ng/ml and 1% of patients who smoke may see CEA levels >10.0 ng/ml. Performed at [facility], [address], [place], [place] [address]     Lab Results  Component Value Date   TIBC 392 02/11/2024   TIBC 375 12/19/2023   TIBC 452.2 (H) 10/15/2023   FERRITIN 104 02/11/2024   FERRITIN 19 (L) 12/19/2023   FERRITIN 5.6 (L) 10/15/2023   IRONPCTSAT 21 02/11/2024   IRONPCTSAT 26 12/19/2023   IRONPCTSAT 6.2 (L) 10/15/2023        Component Value Date/Time   CEA 32.92 (H) 02/11/2024 0848   IGASERUM 433 (H) 10/15/2023 1429    Review Flowsheet  More data exists      Latest Ref Rng & Units 11/27/2023 12/19/2023 02/11/2024  Oncology Labs  Ferritin 24 - 336 ng/mL - 19  104   %SAT 17.9 - 39.5 % - 26  21   CEA ([facility]) 0.00 - 5.00 ng/mL 6.9  - 32.92      STUDIES:   No results found.    ASSESSMENT & PLAN:   Assessment/Plan:  58 y.o. male with stage IVA (T3 N2a M1a) rectal cancer.  He received his 6th and final cycle of neoadjuvant FOLFOX chemotherapy last week.  Respiratory viral panel and strep culture is pending.  Thankfully, his ANC is in good range, so we will forego chest x-ray at his request. I will place him on azithromycin 500 mg daily  pending the test results.  He will keep his appointment in 1 week to review his PET scan to reassess his disease baseline after all 6 cycles of neoadjuvant chemotherapy.  The patient understands all the plans  discussed today and is in agreement with them.  He knows to contact our office if he does not improve or his symptoms worsen.     Andrez DELENA Foy, PA-C   Physician Assistant [facility] [place] [phone]   Addendum: Testing was positive for flu A.  I will place patient on Tamiflu  75 mg twice daily for 5 days.  As above if he does not improve or symptoms worsen he will contact us .

## 2024-04-03 NOTE — Telephone Encounter (Signed)
-----   Message from Andrez DELENA Foy sent at 04/03/2024  1:42 PM EST ----- Please let him know his potassium was a little low and have him increase in his diet. Thanks

## 2024-04-03 NOTE — Telephone Encounter (Signed)
 Patient complaint of fever of 100.3 last night and 100.6 today , also body aches , fatigue, cough and congestion. Patient just finished treatment last week.

## 2024-04-03 NOTE — Telephone Encounter (Signed)
 Called to speak with patient about lab results, his wife answered. Reviewed DPR before speaking with her. I advised that the patient's wife that his potassium was a little low and he needed to eat more potassium rich foods. I also advised her that his swab had came back and he did test positive for flu A. I let her know that Andrez would send in Tamiflu to the pharmacy for him.

## 2024-04-09 NOTE — Progress Notes (Signed)
 [facility] at [facility] [address] [place],  [place]  [address] [phone]  Clinic Day:  04/10/2024  Referring physician: Silver Lamar LABOR, MD   HISTORY OF PRESENT ILLNESS:  The patient is a 58 y.o. male with stage IVA (T3 N2a M1a) rectal cancer, which includes 1 metastatic liver lesion.  He has been receiving FOLFOX chemotherapy with the hope that it will shrink both his rectal cancer and his metastatic liver lesion to where both can be synchronously resected.  He comes in today to go over his PET scan to ascertain his new disease baseline after 6 cycles of  FOLFOX chemotherapy.  Since his last visit, the patient has been doing okay.  He recently contracted the flu, for which he is still recuperating.  As a pertains to his disease, he denies having any new GI symptoms which are concern him for overt signs of disease progression.  PHYSICAL EXAM:  Blood pressure 128/79, pulse 61, temperature 98 F (36.7 C), temperature source Oral, resp. rate 16, height 5' 7 (1.702 m), weight 173 lb 14.4 oz (78.9 kg), SpO2 99%. Wt Readings from Last 3 Encounters:  04/10/24 173 lb 14.4 oz (78.9 kg)  03/24/24 178 lb 1.4 oz (80.8 kg)  03/24/24 178 lb 9.6 oz (81 kg)   Body mass index is 27.24 kg/m. Performance status (ECOG): 1 - Symptomatic but completely ambulatory Physical Exam Constitutional:      Appearance: Normal appearance. He is not ill-appearing.  HENT:     Mouth/Throat:     Mouth: Mucous membranes are moist.     Pharynx: Oropharynx is clear. No oropharyngeal exudate or posterior oropharyngeal erythema.  Cardiovascular:     Rate and Rhythm: Normal rate and regular rhythm.     Heart sounds: No murmur heard.    No friction rub. No gallop.  Pulmonary:     Effort: Pulmonary effort is normal. No respiratory distress.     Breath sounds: Normal breath sounds. No wheezing, rhonchi or rales.  Abdominal:     General: Bowel sounds are normal. There is no distension.      Palpations: Abdomen is soft. There is no mass.     Tenderness: There is no abdominal tenderness.  Musculoskeletal:        General: No swelling.     Right lower leg: No edema.     Left lower leg: No edema.  Lymphadenopathy:     Cervical: No cervical adenopathy.     Upper Body:     Right upper body: No supraclavicular or axillary adenopathy.     Left upper body: No supraclavicular or axillary adenopathy.     Lower Body: No right inguinal adenopathy. No left inguinal adenopathy.  Skin:    General: Skin is warm.     Coloration: Skin is not jaundiced.     Findings: No lesion or rash.  Neurological:     General: No focal deficit present.     Mental Status: He is alert and oriented to person, place, and time. Mental status is at baseline.  Psychiatric:        Mood and Affect: Mood normal.        Behavior: Behavior normal.        Thought Content: Thought content normal.    LABS:      Latest Ref Rng & Units 04/03/2024   11:24 AM 03/24/2024    9:06 AM 03/10/2024    8:50 AM  CBC  WBC 4.0 - 10.5 K/uL  2.1  3.8  3.7   Hemoglobin 13.0 - 17.0 g/dL 88.2  87.1  87.0   Hematocrit 39.0 - 52.0 % 35.4  38.6  38.7   Platelets 150 - 400 K/uL 126  140  142       Latest Ref Rng & Units 04/03/2024   11:24 AM 03/24/2024    9:06 AM 03/10/2024    8:50 AM  CMP  Glucose 70 - 99 mg/dL 881  875  92   BUN 6 - 20 mg/dL 10  14  10    Creatinine 0.61 - 1.24 mg/dL 8.79  9.00  8.94   Sodium 135 - 145 mmol/L 137  139  140   Potassium 3.5 - 5.1 mmol/L 3.3  3.9  3.8   Chloride 98 - 111 mmol/L 104  106  105   CO2 22 - 32 mmol/L 23  24  25    Calcium  8.9 - 10.3 mg/dL 9.2  9.4  9.4   Total Protein 6.5 - 8.1 g/dL 6.6  7.0  6.5   Total Bilirubin 0.0 - 1.2 mg/dL 0.4  0.5  0.4   Alkaline Phos 38 - 126 U/L 94  111  93   AST 15 - 41 U/L 42  36  31   ALT 0 - 44 U/L 31  31  36    Lab Results  Component Value Date   CEA 32.92 (H) 02/11/2024   CEA 6.9 (H) 11/27/2023   PATHOLOGY (11-27-23): FINAL DIAGNOSIS         1. Duodenum, Biopsy,  :       - DUODENAL MUCOSA WITH NO SPECIFIC HISTOPATHOLOGIC CHANGES       - NEGATIVE FOR INCREASED INTRAEPITHELIAL LYMPHOCYTES OR VILLOUS ARCHITECTURAL       CHANGES        2. 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 Ascending  Colon Polyp, x 1 :       - TUBULAR ADENOMA(S)       - NEGATIVE FOR HIGH-GRADE DYSPLASIA OR MALIGNANCY        4. Rectum, biopsy, circumferential apple core mass :       - INVASIVE MODERATELY DIFFERENTIATED ADENOCARCINOMA, SEE COMMENT    STUDIES: His PET scan from 04/09/2024 revealed the following:  IMAGES ARE IN, BUT THE OFFICIAL RESULTS ARE PENDING  ASSESSMENT & PLAN:  A 58 y.o. male with stage IVA (T3 N2a M1a) rectal cancer.  In clinic today, I went over his PET scan images with him, for which he could see that the previously hypermetabolic activity of his liver lesion has essentially dissipated.  Although there is still evidence of hypermetabolic activity with his primary rectal lesion, it is clearly less in both size and severity.  Understandably, the patient was pleased with his PET scan images.  Moving forward, I will refer this patient to general surgery in [facility] so they can hopefully remove both his primary rectal cancer and perform a metastatectomy of his isolated liver lesion.  I will tentatively see this patient back in 6 weeks.  That visit may change depending on how quickly this surgery gets scheduled.  The patient understands that I strongly am considering giving him an additional 6 cycles of FOLFOX chemotherapy in the adjuvant setting.  He understands the goal of all of his therapy is to ultimately cure him of his disease, even though  he has an initial stage IV diagnosis.  The patient understands all the plans discussed today and is in agreement with them.  Orlan [employer] DELENA Kerns, MD

## 2024-04-10 ENCOUNTER — Telehealth: Payer: Self-pay | Admitting: Oncology

## 2024-04-10 ENCOUNTER — Inpatient Hospital Stay: Payer: Self-pay | Admitting: Oncology

## 2024-04-10 ENCOUNTER — Other Ambulatory Visit: Payer: Self-pay | Admitting: Oncology

## 2024-04-10 VITALS — BP 128/79 | HR 61 | Temp 98.0°F | Resp 16 | Ht 67.0 in | Wt 173.9 lb

## 2024-04-10 DIAGNOSIS — C20 Malignant neoplasm of rectum: Secondary | ICD-10-CM

## 2024-04-10 DIAGNOSIS — C787 Secondary malignant neoplasm of liver and intrahepatic bile duct: Secondary | ICD-10-CM

## 2024-04-10 NOTE — Telephone Encounter (Signed)
 Patient has been scheduled for follow-up visit per 04/10/2024 LOS.  Pt given an appt calendar with date and time.

## 2024-04-11 ENCOUNTER — Other Ambulatory Visit: Payer: Self-pay

## 2024-04-14 NOTE — Progress Notes (Signed)
 PATIENT NAME: Robert Mitchell MRN: [id number] DOB: [birth year] PHYSICIANS:  REFERRING PHYSICIAN:  Ezzard Valaria LABOR, MD  CARE TEAM:   Patient Care Team: Silver Lamar LABOR, MD as PCP - General (Family Medicine) Ezzard Valaria LABOR, MD (Hematology and Oncology) Charlanne Groom, MD as Referring Physician (Gastroenterology) Sheldon, Elspeth Bitter, MD as Consulting Provider (Colon and Rectal Surgery) Lininger, Ozell Lenis, MD as Referring Physician (General Surgery) Harl Eleanor LABOR, NP (Hematology and Oncology)  CONSULTING PROVIDER:  ELSPETH BITTER SHELDON, MD     SUBJECTIVE   Chief Complaint: Rectal Cancer    Robert Mitchell is a 58 y.o. male  who is seen today as an office consultation  at the request of DrRONITA Ezzard  for evaluation of metastatic rectal cancer  History of Present Illness: Level of Interpreter Services: No interpreter needed (no language barrier)  58 year old male  Patient noticed rectal bleeding with unintentional weight loss.  Had some bowel changes H. pylori.  Worsening discomfort. Underwent colonoscopy 11/27/2023.  Found to have low rectal cancer.  Workup showing solitary liver metastasis.  Saw medical oncology Dr. Ezzard in [place].  Portacatheter placed by general surgery.  Started on FOLFOX chemotherapy 01/14/2024.  After 6 cycles, sent to me today to consider liver resection and rectosigmoid resection.  I am meeting him for the first time.  Patient here with his wife.  Patient normally had 1 soft bowel movement in the morning before all this happened.  Then became much more watery and crampy.  Since he said treatment he is actually been more constipated needing to take Senokot or he will move his bowels for 2 or 3 days.  He has not smoked in many decades.  Used to dip to quit and on nicotine  patch.  No diabetes.  He is never had any abdominal surgery and rectal surgery.  No nocturia.  No impotence.  No urinary incontinence.  He can walk several miles without  difficulty.  Never any cardiac or pulmonary issues.  Since his rectal cancer has been treated chemotherapy, bleeding has gone away.     Medical History:  Past Medical History:  Diagnosis Date   History of cancer     Patient Active Problem List  Diagnosis   Rectal cancer metastasized to liver (CMS/HHS-HCC)   Metastasis to liver (CMS/HHS-HCC)   Unintentional weight loss    Past Surgical History:  Procedure Laterality Date   portacath     2025     No Known Allergies  Current Outpatient Medications on File Prior to Visit  Medication Sig Dispense Refill   fluticasone  propionate (FLONASE ) 50 mcg/actuation nasal spray Place 1 spray into both nostrils 2 (two) times daily as needed for Rhinitis     nicotine  (NICODERM CQ ) 14 mg/24 hr patch RX #2 Weeks 5-6: 14 mg x 1 patch daily. Wear for 24 hours. If you have sleep disturbances, remove at bedtime.     testosterone cypionate (DEPO-TESTOSTERONE) 200 mg/mL injection Inject 200 mg into the muscle     No current facility-administered medications on file prior to visit.    Family History  Problem Relation Age of Onset   Stroke Father      Social History   Tobacco Use  Smoking Status Former   Types: Cigarettes  Smokeless Tobacco Not on file     Social History   Socioeconomic History   Marital status: Married  Tobacco Use   Smoking status: Former    Types: Cigarettes  Substance and Sexual Activity   Drug  use: Never   Social Drivers of Architectural Technologist Insecurity: Low Risk (01/15/2024)   Received from [facility]   Hunger Vital Sign    Within the past 12 months, you worried that your food would run out before you got money to buy more: Never true    Within the past 12 months, the food you bought just didn't last and you didn't have money to get more. : Never true  Transportation Needs: No Transportation Needs (01/15/2024)   Received from [facility]    In the past 12 months, has lack  of reliable transportation kept you from medical appointments, meetings, work or from getting things needed for daily living? : No  Housing Stability: Unknown (04/14/2024)   Housing Stability Vital Sign    Homeless in the Last Year: No    ############################################################  Review of Systems: A complete review of systems (ROS) was obtained from the patient.   We have reviewed this information and discussed as appropriate with the patient.   See HPI as well for other pertinent ROS.  Constitutional:  No fevers, chills, sweats.  Weight stable Eyes:  No vision changes, No discharge HENT:  No sore throats, nasal drainage Lymph: No neck swelling, No bruising easily Pulmonary:  No cough, productive sputum CV: No orthopnea, PND . No exertional chest/neck/shoulder/arm pain.  Patient can walk >5 miles without difficulty.    GI: Aside from the rectal cancer diagnosis, no other personal nor family history of GI/colon cancer, inflammatory bowel disease, irritable bowel syndrome, allergy such as Celiac Sprue, dietary/dairy problems, colitis, ulcers nor gastritis.  No recent sick contacts/gastroenteritis.  No travel outside the country.  No changes in diet.  Renal: No UTIs, No hematuria Genital:  No drainage, bleeding, masses Musculoskeletal: No severe joint pain.  Good ROM major joints Skin:  No sores or lesions Heme/Lymph:  No easy bleeding.  No swollen lymph nodes Neuro:  No active seizures.  No facial droop Psych:  No hallucinations.  No agitation  OBJECTIVE   Vitals:   04/14/24 1423  BP: (!) 130/90  Pulse: 86  Weight: 80.7 kg (178 lb)  Height: 167.6 cm (5' 6)    Body mass index is 28.73 kg/m.  PHYSICAL EXAM:   Constitutional: Not cachectic.  Hygeine adequate.  Vitals signs as above.   Eyes: No glasses.  Vision adequate,Pupils reactive, normal extraocular movements. Sclera nonicteric Neuro: CN II-XII intact.  No major focal sensory defects.  No major  motor deficits. Lymph: No head/neck/groin lymphadenopathy Psych:  No severe agitation.  No severe anxiety.  Judgment & insight Adequate, Oriented x4, HENT: Normocephalic, Mucus membranes moist.  No thrush.  Hearing: adequate Neck: Supple, No tracheal deviation.  No obvious thyromegaly Chest: No pain to chest wall compression.  Good respiratory excursion.  No audible wheezing CV:  Pulses intact.  regular.  No major extremity edema Ext: No obvious deformity or contracture.  Edema: Not present.  No cyanosis Skin: No major subcutaneous nodules.  Warm and dry Musculoskeletal: Severe joint rigidity not present.  No obvious clubbing.  No digital petechiae.  Mobility: no assist device moving easily without restrictions  Abdomen:  Flat Soft.   Nondistended.  Nontender.    Hernia: Present at: Umbilical stalk, size .5x.5cm. Diastasis recti: Not present.  No hepatomegaly.  No splenomegaly.  Genital/Pelvic:  Inguinal hernia: Not present.  Inguinal lymph nodes: without lymphadenopathy nor hidradenitis.    Rectal:  Perianal skin Clean with good hygiene  Pruritis ani:  Not present Pilonidal disease: Not present  External hemorrhoids: Right anterior and left lateral partially prolapsed broad flat hemorrhoids Condyloma / warts: Not present Anal fissure: Not present Perirectal abscess/fistula: Not present Sphincter tone: Normal without anal spasming  Digital and anoscopic rectal exam: Tolerated   Hemorrhoidal piles: Grade 2-3 Prostate:  Enlarged but smooth Rectovaginal septum: N/A Rectal masses: There is a rectal mass involving about half of the circumference distally.  Primarily left lateral.  Left anterior to right posterior.  Closest margin left lateral about 5 cm from anal verge.  Mostly scallop and lobulated but fixed to the left lateral rectal wall  Other significant findings:  N/A  PE Chaperone note: Russell Christine, CMA, was included in the room as chaperone for sensitive portions of the  exam    ###################################    ###################################################################  Labs, Imaging and Diagnostic Testing:  Located in 'Care Everywhere' section of Epic EMR chart  Films were reviewed with Dr. Dasie with surgical oncology/liver and pancreas surgery.  Liver mass is larger but less avid on PET scan  CEA 6.9 on 11/27/2023  CEA 32.9 to 02/11/2024 CEA 12/11 ordered but not done  PRIOR [facility] NOTES:  Not applicable   SURGERY NOTES:  Not applicable   PATHOLOGY:  Located in 'Care Everywhere' section of Epic EMR chart    Assessment and Plan:  DIAGNOSES:  Diagnoses and all orders for this visit:  Metastasis to liver (CMS/HHS-HCC)  Rectal cancer metastasized to liver (CMS/HHS-HCC)  Unintentional weight loss     ASSESSMENT/PLAN  Mid to low rectal cancer that appears circumferential on initial evaluation but at least 50% with distal margin around 5 cm from anal verge by digital exam.  Fixed.  No change in size on MRI.  Described as 3 cm from the dentate line would be somewhat similar on initial colonoscopy 11/27/2023  Patient with solitary liver mass that looks larger on treatment but less PET avid on follow-up PET scan.  CEA level has increased since treatment.  He is due for another 1 but I do not think that has happened yet.  I recommend they get that repeated through the cancer center.  Standard of care is to consider total neoadjuvant chemotherapy.  Upfront chemotherapy then chemoradiation then possible low anterior rectosigmoid resection with a diverting loop ileostomy versus APR resection.  Patient has received 6 cycles of FOLFOX so far.  I think this is proximal enough that I can avoid APR. he seems to have decent bowel function activity and continence that it be reasonable to try and do a low colorectal/coloanal anastomosis and avoid a permanent ostomy.  Patient would like to avoid a permanent ostomy.  Because the  primary has not gone down in size on chemotherapy only, recommend he see radiation oncology.  For consider of chemoradiation therapy to allow further tumor shrinkage and control.  I reviewed the images with Dr. Dasie who does liver surgery.  Mass is larger but appears to be more necrotic and less PET avid.  CEA level increased on chemotherapy but he is due for follow-up.  That would be helpful.  Patient could benefit from repeat triple contrast imaging of the liver for more accurate staging.  Could be resectional but not a classic location for wedge resection only.  Hepatic lobectomy would be a bigger operation.  May benefit from some portal vein embolization as well for preoperative planning.  Would like to avoid extra chemotherapy since he has already had 6 cycles of FOLFOX and more  may make liver resection be less tolerated from the remaining residual liver to function  I asked that this put on the GI tumor board to soon as possible to get a multidisciplinary comprehensive plan.  He is only seeing medical oncology so far.  FOLLOWUP:  Return for Call office with update on your progress in 1 week.  I have reviewed this patient's available data, including medical history, doctor notes, radiology & other studies, events of note, test results, physical exam, etc as part of my evaluation.   A significant portion of that time was spent in counseling in discussion of management, need for further testing, need for other medical or surgical input, etc.   Care was provided by me.  Based on my assessment, this care required MODERATE- Level 4 level of medical decision making.  04/14/2024   The plan was discussed in detail with the patient today, who expressed understanding & appreciation.  The patient has my contact information, and understands to call me with any additional questions or concerns.  I & my group would be happy to see the patient back sooner if the need arises.  Of note, portions of this report  may have been transcribed using voice recognition software. Effort was made to ensure accuracy; however, inadvertent computerized transcription errors with sound-a-like substitutions may be present.   Any transcriptional errors that result from this process are unintentional.  ########################################################   Elspeth KYM Schultze, MD, FACS, MASCRS Esophageal, Gastrointestinal & Colorectal Surgery Robotic and Minimally Invasive Surgery  [facility]  [address] [place], [place] [address] [phone] Fax [phone] Main              04/14/2024

## 2024-04-15 ENCOUNTER — Telehealth: Payer: Self-pay

## 2024-04-15 ENCOUNTER — Telehealth: Payer: Self-pay | Admitting: Radiation Oncology

## 2024-04-15 NOTE — Telephone Encounter (Addendum)
 Pt is travelling today in tractor trailer and wouldn't be able to hear well. So he asked his wife to call here and ask you a couple of questions. They saw the colorectal surgeon yesterday. He was told that his liver tumor had gotten bigger but the PET didn't show a glow around it. Why is it bigger? Is it dying off? Also his CEA is higher than last time. In October it was 32.92. He had it drawn @ [facility] yesterday- surgeon sent him there with order- and it is 39.62. Message sent to Dr Ezzard.    Dr Ezzard states he went over the scans in detail with Robert Mitchell while he was in office and that nothing has changed from his perspective. The liver mass is necrotic (dying) and isn't lighting up on PET as before. He didn't order chemoradiation because of the liver involvement. Also,he isn't concerned about the difference in CEA marker going from 32 to 39, as it isn't a large difference. Dr Ezzard read the note from Dr Sheldon while I was in room with him. He states he will have to speak with Dr Sheldon regarding the appointment yesterday.

## 2024-04-15 NOTE — Telephone Encounter (Signed)
 LVM on mobile number. Reached pt on home number, able to schedule urgent consult with Dr. Dewey.

## 2024-04-16 ENCOUNTER — Encounter: Payer: Self-pay | Admitting: Oncology

## 2024-04-16 NOTE — Progress Notes (Signed)
 GI Location of Tumor / Histology: Rectal cancer with liver mets  Robert Mitchell was diagnosed with rectal cancer in July 2025 after presenting with rectal bleeding and unintentional weight loss.    Biopsies of Rectal Mass 11/27/2023    Past/Anticipated interventions by surgeon, if any:  Dr. Sheldon 04/14/2024 -Standard of care is to consider total neoadjuvant chemotherapy.  Upfront chemotherapy then chemoradiation then possible low anterior rectosigmoid resection with a diverting loop ileostomy versus APR resection.   -Patient has received 6 cycles of FOLFOX so far.  I think this is proximal enough that I can avoid APR. He seems to have decent bowel function activity and continence that it be reasonable to try and do a low colorectal/coloanal anastomosis and avoid a permanent ostomy.  Patient would like to avoid a permanent ostomy. -Because the primary has not gone down in size on chemotherapy only, recommend he see radiation oncology.  For consider of chemoradiation therapy to allow further tumor shrinkage and control. -Patient could benefit from repeat triple contrast imaging of the liver for more accurate staging. Could be resectional but not a classic location for wedge resection only. Hepatic lobectomy would be a bigger operation. -May benefit from some portal vein embolization as well for preoperative planning.  -Would like to avoid extra chemotherapy since he has already had 6 cycles of FOLFOX and more may make liver resection be less tolerated from the remaining residual liver to function    Past/Anticipated interventions by medical oncology, if any:  Dr. Ezzard 04/10/2024 -I went over his PET scan images with him, for which he could see that the previously hypermetabolic activity of his liver lesion has essentially dissipated. Although there is still evidence of hypermetabolic activity with his primary rectal lesion, it is clearly less in both size and severity.  -Moving forward, I will refer  this patient to general surgery in [facility] so they can hopefully remove both his primary rectal cancer and perform a metastatectomy of his isolated liver lesion.   -I will tentatively see this patient back in 6 weeks.    Weight changes, if any: He reports slow gain of some pounds.  Bowel/Bladder complaints, if any: Constipation, trying senna, started miralax BID a couple days ago.  Nausea / Vomiting, if any: None  Pain issues, if any:  He reports some discomfort/pressure to the right abdomen over the liver.  Any blood per rectum: no      SAFETY ISSUES: Prior radiation? No Pacemaker/ICD? No Possible current pregnancy? N/a Is the patient on methotrexate? No  Current Complaints/Details:

## 2024-04-17 ENCOUNTER — Ambulatory Visit
Admission: RE | Admit: 2024-04-17 | Discharge: 2024-04-17 | Disposition: A | Payer: Self-pay | Source: Ambulatory Visit | Attending: Radiation Oncology

## 2024-04-17 ENCOUNTER — Ambulatory Visit
Admission: RE | Admit: 2024-04-17 | Discharge: 2024-04-17 | Disposition: A | Discharge status: Home / Self Care | Payer: Self-pay | Source: Ambulatory Visit | Attending: Radiation Oncology | Admitting: Radiation Oncology

## 2024-04-17 ENCOUNTER — Encounter: Payer: Self-pay | Admitting: Radiation Oncology

## 2024-04-17 VITALS — BP 134/83 | HR 78 | Temp 97.6°F | Resp 18 | Ht 67.0 in | Wt 174.0 lb

## 2024-04-17 DIAGNOSIS — Z85828 Personal history of other malignant neoplasm of skin: Secondary | ICD-10-CM | POA: Insufficient documentation

## 2024-04-17 DIAGNOSIS — Z800 Family history of malignant neoplasm of digestive organs: Secondary | ICD-10-CM | POA: Insufficient documentation

## 2024-04-17 DIAGNOSIS — M129 Arthropathy, unspecified: Secondary | ICD-10-CM | POA: Insufficient documentation

## 2024-04-17 DIAGNOSIS — C787 Secondary malignant neoplasm of liver and intrahepatic bile duct: Secondary | ICD-10-CM | POA: Insufficient documentation

## 2024-04-17 DIAGNOSIS — Z87440 Personal history of urinary (tract) infections: Secondary | ICD-10-CM | POA: Insufficient documentation

## 2024-04-17 DIAGNOSIS — Z8042 Family history of malignant neoplasm of prostate: Secondary | ICD-10-CM | POA: Insufficient documentation

## 2024-04-17 DIAGNOSIS — Z87891 Personal history of nicotine dependence: Secondary | ICD-10-CM | POA: Insufficient documentation

## 2024-04-17 DIAGNOSIS — K449 Diaphragmatic hernia without obstruction or gangrene: Secondary | ICD-10-CM | POA: Insufficient documentation

## 2024-04-17 DIAGNOSIS — K219 Gastro-esophageal reflux disease without esophagitis: Secondary | ICD-10-CM | POA: Insufficient documentation

## 2024-04-17 DIAGNOSIS — Z87442 Personal history of urinary calculi: Secondary | ICD-10-CM | POA: Insufficient documentation

## 2024-04-17 DIAGNOSIS — R197 Diarrhea, unspecified: Secondary | ICD-10-CM | POA: Insufficient documentation

## 2024-04-17 DIAGNOSIS — C20 Malignant neoplasm of rectum: Secondary | ICD-10-CM | POA: Insufficient documentation

## 2024-04-17 DIAGNOSIS — Z8701 Personal history of pneumonia (recurrent): Secondary | ICD-10-CM | POA: Insufficient documentation

## 2024-04-17 NOTE — Progress Notes (Signed)
 Radiation Oncology         [phone] ________________________________  Name: Robert Mitchell        MRN: [id number]  Date of Service: 04/17/2024 DOB: 1965-08-03  RR:Mnaapwd, Lamar LABOR, MD  Sheldon Standing, MD     REFERRING PHYSICIAN: Sheldon Standing, MD   DIAGNOSIS: The encounter diagnosis was Rectal cancer metastasized to liver [facility]).   HISTORY OF PRESENT ILLNESS: Robert Mitchell is a 58 y.o. male seen at the request of Dr. 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  He was diagnosed with his tumor in the summer 2025, he had been experiencing discomfort in difficulty with rectal bleeding weight loss and bowel changes.  Colonoscopy on 11/27/2023 showed a low-lying tumor in the rectum biopsied as adenocarcinoma. He underwent CT imaging that showed concerns for a lesion in the liver. An MRI on 12/13/23 showed a lesion in segment 5 measuring 4.3 cm and an MRI pelvis on 12/14/23 noted his tumor to be T3dN2. Given his liver disease,  he was started on systemic FOLFOX chemotherapy with Dr. Ezzard in [place] on 01/14/2024. He has completed 6 cycles of this on 03/24/24. Of note his CEA levels were 6.8 in July 2025 32.92 in October 2025, 39.6 on 04/15/2024. He did have a PET scan on 04/15/24 that showed the nodule in segment 5 to measure 4.8 cm, though the SUV was lower in this site than background. His lesion in the rectum was 6 cm in length and SUV was 23, no new disease was noted. He met with Dr. Sheldon and he recommended chemoradiation to the pelvis and to consider liver resection at the conclusion of treatment to the pelvis with anticipated surgical resection of the primary tumor as well. He's seen today to discuss chemoradiation.      PREVIOUS RADIATION THERAPY: No   PAST MEDICAL HISTORY:  Past Medical History:  Diagnosis Date   Arthritis    Colon cancer (HCC)    Diarrhea    GERD (gastroesophageal reflux disease)    Hiatal hernia    History of basal cell carcinoma (BCC) excision     RIGHT FACE   History of renal calculi    Pneumonia    UTI (urinary tract infection)        PAST SURGICAL HISTORY: Past Surgical History:  Procedure Laterality Date   COLONOSCOPY W/ POLYPECTOMY     SKIN CANCER EXCISION     BASAL CELL OF RIGHT FACE     FAMILY HISTORY:  Family History  Problem Relation Age of Onset   Colitis Sister    Aortic aneurysm Brother    Colon cancer Maternal Uncle    Prostate cancer Maternal Uncle    Prostate cancer Paternal Uncle    Colon cancer Paternal Grandmother      SOCIAL HISTORY:  reports that he quit smoking about 31 years ago. His smoking use included cigarettes. His smokeless tobacco use includes chew. He reports that he does not currently use alcohol. He reports that he does not currently use drugs. The patient is married and lives in [place]. He is accompanied by his wife. 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    ALLERGIES: Patient has no known allergies.   MEDICATIONS:  Current Outpatient Medications  Medication Sig Dispense Refill   azithromycin  (ZITHROMAX ) 500 MG tablet  Take 1 tablet (500 mg total) by mouth daily. 7 tablet 0   fluticasone  (FLONASE ) 50 MCG/ACT nasal spray Place 1 spray into both nostrils 2 (two) times daily as needed for rhinitis. 17 mL 0   hydrocortisone  (ANUSOL -HC) 2.5 % rectal cream Place 1 Application rectally 2 (two) times daily. 30 g 1   loperamide (IMODIUM A-D) 2 MG tablet Take 2 mg by mouth 4 (four) times daily as needed for diarrhea or loose stools.     LORazepam  (ATIVAN ) 1 MG tablet Take 1 tablet (1 mg total) by mouth every 8 (eight) hours. 30 tablet 0   mirtazapine  (REMERON ) 15 MG tablet TAKE 1 TABLET BY MOUTH EVERYDAY AT BEDTIME 90 tablet 1   nicotine  (NICODERM CQ  - DOSED IN MG/24 HOURS) 14 mg/24hr patch RX #2 Weeks 5-6: 14 mg x 1 patch daily.  Wear for 24 hours. If you have sleep disturbances, remove at bedtime. 28 patch 0   nicotine  (NICODERM CQ  - DOSED IN MG/24 HR) 7 mg/24hr patch RX #3 Weeks 7-8: 7 mg x 1 patch daily. Wear for 24 hours. If you have sleep disturbances, remove at bedtime. 14 patch 0   NON FORMULARY Take 18 Billion Cells by mouth as directed. PB RESTORE - PRO-& POSTBIOTICS AND BACTERIOPHAGES     NON FORMULARY Take 7 Billion Cells by mouth as directed. PB ASSIST+ PROBIOME GUT COMPLEX     NON FORMULARY Take 900 mg by mouth as directed. ESSENTIAL OIL OMEGA COMPLEX - THREE TABS DAILY     NON FORMULARY FRANKINCENSE BOSWELLIC ACID COMPLEX - INFLAMMATION AND PAIN     NON FORMULARY COPAIBA - IMMUNE SYSTEM, INFLAMMATION & PAIN     NON FORMULARY DDR PRIME - IMMUNE SYSTEM SUPPORT & CELLULAR SUPPORT     NON FORMULARY SERENITY - SLEEP & RELAXATION     NON FORMULARY TUMERIC - INFLAMMATION AND JOINT PAIN     NON FORMULARY VITAMIN C - IMMUNE SYSTEM SUPPORT     ondansetron  (ZOFRAN ) 4 MG tablet Take 1 tablet (4 mg total) by mouth every 8 (eight) hours as needed for nausea or vomiting. 30 tablet 1   oseltamivir  (TAMIFLU ) 75 MG capsule Take 1 capsule (75 mg total) by mouth 2 (two) times daily. 10 capsule 0   pantoprazole  (PROTONIX ) 40 MG tablet TAKE 1 TABLET BY MOUTH EVERY DAY 90 tablet 2   prochlorperazine  (COMPAZINE ) 10 MG tablet Take 1 tablet (10 mg total) by mouth every 6 (six) hours as needed for nausea or vomiting. 90 tablet 3   testosterone cypionate (DEPOTESTOSTERONE CYPIONATE) 200 MG/ML injection Inject 200 mg into the muscle once a week.     Current Facility-Administered Medications  Medication Dose Route Frequency Provider Last Rate Last Admin   0.9 %  sodium chloride  infusion  500 mL Intravenous Once Charlanne Groom, MD         REVIEW OF SYSTEMS: On review of systems, the patient reports that he is doing okay. He is frustrated that he wasn't aware radiation might be part of his treatment plan. He is concerned about the  potential side effects, financial cost of treatment, and also the overall picture of his liver disease. He reports he is having rectal pressure and urgency to have a bowel movement almost constantly. He is not having any rectal bleeding but does admit to having mucous per rectum most days. He feels fullness in his upper right abdomen without nausea or emesis. No other complaints are verbalized.      PHYSICAL EXAM:  Wt Readings from Last 3 Encounters:  04/10/24 173 lb 14.4 oz (78.9 kg)  03/24/24 178 lb 1.4 oz (80.8 kg)  03/24/24 178 lb 9.6 oz (81 kg)   Temp Readings from Last 3 Encounters:  04/10/24 98 F (36.7 C) (Oral)  04/03/24 100 F (37.8 C) (Oral)  03/26/24 98.4 F (36.9 C) (Oral)   BP Readings from Last 3 Encounters:  04/10/24 128/79  04/03/24 119/72  03/26/24 119/79   Pulse Readings from Last 3 Encounters:  04/10/24 61  04/03/24 95  03/26/24 74    /10  In general this is a well appearing caucasian male in no acute distress. He's alert and oriented x4 and appropriate throughout the examination. Cardiopulmonary assessment is negative for acute distress and he exhibits normal effort.     ECOG = 1  0 - Asymptomatic (Fully active, able to carry on all predisease activities without restriction)  1 - Symptomatic but completely ambulatory (Restricted in physically strenuous activity but ambulatory and able to carry out work of a light or sedentary nature. For example, light housework, office work)  2 - Symptomatic, <50% in bed during the day (Ambulatory and capable of all self care but unable to carry out any work activities. Up and about more than 50% of waking hours)  3 - Symptomatic, >50% in bed, but not bedbound (Capable of only limited self-care, confined to bed or chair 50% or more of waking hours)  4 - Bedbound (Completely disabled. Cannot carry on any self-care. Totally confined to bed or chair)  5 - Death   Raylene MM, Creech RH, Tormey DC, et al. [phone]).  Toxicity and response criteria of the [facility]. Am. DOROTHA Bridges. Oncol. 5 (6): 649-55    LABORATORY DATA:  Lab Results  Component Value Date   WBC 2.1 (L) 04/03/2024   HGB 11.7 (L) 04/03/2024   HCT 35.4 (L) 04/03/2024   MCV 94.1 04/03/2024   PLT 126 (L) 04/03/2024   Lab Results  Component Value Date   NA 137 04/03/2024   K 3.3 (L) 04/03/2024   CL 104 04/03/2024   CO2 23 04/03/2024   Lab Results  Component Value Date   ALT 31 04/03/2024   AST 42 (H) 04/03/2024   ALKPHOS 94 04/03/2024   BILITOT 0.4 04/03/2024      RADIOGRAPHY: No results found.     IMPRESSION/PLAN: 1. 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 Dr. Dewey discusses the pathology findings and reviews the nature of rectal carcinoma. In this patient's case, the disease in the liver appears to be improving in the hypermetabolism and the size may have been slightly larger but his chemo was started several weeks after his initial MRI. Given what appears to be improvement in liver disease, Dr. Dewey feels it would be appropriate to consider chemoradiation to the pelvis in order to try to achieve goals of surgical resection.  We discussed the risks, benefits, short, and long term effects of radiotherapy, as well as the curative intent for this localized therapy, and the patient is interested in proceeding. Dr. Dewey discusses the delivery and logistics of radiotherapy and anticipates a course of 5 1/2 weeks of radiotherapy. Written consent is obtained and placed in the chart, a copy was provided to the patient. The patient will be contacted to coordinate treatment planning by our simulation department. We will plan to start treatment on 04/28/24. 2. 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 We discussed the importance of  evaluation with physical therapy prior to pelvic radiation. A referral was placed to physical therapy today.    In a visit lasting 75 minutes,  greater than 50% of the time was spent face to face discussing the patient's condition, in preparation for the discussion, and coordinating the patient's care.   The above documentation reflects my direct findings during this shared patient visit. Please see the separate note by Dr. Dewey on this date for the remainder of the patient's plan of care.    Donald KYM Husband, [facility]   **Disclaimer: This note was dictated with voice recognition software. Similar sounding words can inadvertently be transcribed and this note may contain transcription errors which may not have been corrected upon publication of note.**

## 2024-04-21 ENCOUNTER — Other Ambulatory Visit: Payer: Self-pay

## 2024-04-21 ENCOUNTER — Ambulatory Visit: Payer: Self-pay | Admitting: Radiation Oncology

## 2024-04-21 DIAGNOSIS — F17210 Nicotine dependence, cigarettes, uncomplicated: Secondary | ICD-10-CM

## 2024-04-21 MED ORDER — NICOTINE 7 MG/24HR TD PT24: MEDICATED_PATCH | TRANSDERMAL | 0 refills | Status: DC

## 2024-04-21 NOTE — Therapy (Signed)
 " OUTPATIENT PHYSICAL THERAPY MALE PELVIC EVALUATION   Patient Name: Robert Mitchell MRN: [id number] DOB:July 29, 1965, 58 y.o., male Today's Date: 04/22/2024  END OF SESSION:  PT End of Session - 04/22/24 0940     Visit Number 1    Date for Recertification  10/21/24    Authorization Type self pay    PT Start Time 0845    PT Stop Time 0940    PT Time Calculation (min) 55 min    Activity Tolerance Patient tolerated treatment well;No increased pain    Behavior During Therapy WFL for tasks assessed/performed          Past Medical History:  Diagnosis Date   Arthritis    Colon cancer (HCC)    Diarrhea    GERD (gastroesophageal reflux disease)    Hiatal hernia    History of basal cell carcinoma (BCC) excision    RIGHT FACE   History of renal calculi    Pneumonia    UTI (urinary tract infection)    Past Surgical History:  Procedure Laterality Date   COLONOSCOPY W/ POLYPECTOMY     SKIN CANCER EXCISION     BASAL CELL OF RIGHT FACE   Patient Active Problem List   Diagnosis Date Noted   Rectal cancer metastasized to liver (HCC) 12/19/2023    PCP: Silver Lamar LABOR, MD  REFERRING PROVIDER: Lanell Donald Stagger, PA-C  Pre radiation pelvic floor clinic evaluation for rectal cancer  Starting treatment in early January    REFERRING DIAG: C20,C78.7 (ICD-10-CM) - Rectal cancer metastasized to liver [facility])  THERAPY DIAG:  Other lack of coordination  Rationale for Evaluation and Treatment: Rehabilitation  ONSET DATE: 09/2023  SUBJECTIVE:                                                                                                                                                                                           SUBJECTIVE STATEMENT: Wife stated that he needs pelvic floor therapy.  Patient never had PT before.  Patient reports he feel fine, has constipations since he started chemo. 15 week now. Maybe will have more chemo ( pill )  Not as strong as he was- he is  weaker and fatigued, wants to sleep all time.  Has pressure in his ribs on his right side, pressure in his rectum. Cancer started in rectum, they found cancer in June, so far they started with chemo, they will do do radiation in a few days, then surgery.  Radiation will be 27-28 treatments in 5.5 weeks.  Tumor has everything closed up- wife says 2.3 inch x 0.5inch     Fluid intake: max  30 oz of water/ day, reports tat he hates water  PAIN:  Are you having pain? no  PRECAUTIONS: None  RED FLAGS: None   WEIGHT BEARING RESTRICTIONS: No  FALLS:  Has patient fallen in last 6 months? 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, loaders, physical, heavy lifting- sometimes too tired to do anything and since treatment he has been tired  PLOF: Independent  PATIENT GOALS: wants to lift weights again, afraid to lift due to having a port Not to have bowel leakage  PERTINENT HISTORY:  Arthritis in his bilateral shoulders Sexual abuse: no  BOWEL MOVEMENT: constipation- has to take miralax (2x/day)and senna Pain with bowel movement: No Type of bowel movement:Type (Bristol Stool Scale) 4, 5-7 Fully empty rectum:  hart to tell Leakage: Yes:  sometimes Pads: No Fiber supplement: Yes: senna  URINATION: no issues  INTERCOURSE: inactive    OBJECTIVE:  Note: Objective measures were completed at Evaluation unless otherwise noted.  DIAGNOSTIC FINDINGS:    PATIENT SURVEYS:    PFIQ-7 to be filled  COGNITION: Overall cognitive status: Within functional limits for tasks assessed     SENSATION: Light touch: Appears intact Proprioception: Appears intact  MUSCLE LENGTH: Hamstrings: Right 70 D deg; Left 70 D deg   LUMBAR SPECIAL TESTS:  Straight leg raise test: Positive for tightness  FUNCTIONAL TESTS:  5  times sit to stand: 13  GAIT: Distance walked: throughout the clinic Assistive device utilized: None Level of assistance: Complete Independence Comments: slightly antalgic  POSTURE: No Significant postural limitations, rounded shoulders, and forward head  PELVIC ALIGNMENT: even  LUMBARAROM/PROM: full   LOWER EXTREMITY AROM/PROM: bilateral hips full  LOWER EXTREMITY MMT: 5/5 throughout hips   PALPATION: GENERAL some tenderness and restrictions throughout abdomen              External Perineal Exam within functional limitations               Internal Pelvic Floor able to contract- but no lift present, VC's to lift and exhale helped, able to lengthen Patient confirms identification and approves PT to assess internal pelvic floor and treatment Yes  PELVIC MMT:   MMT eval  Internal Anal Sphincter 5/5  External Anal Sphincter 4/5  Puborectalis   Diastasis Recti no  (Blank rows = not tested)  TONE: average  5 sit to stand 13 s  TODAY'S TREATMENT:                                                                                                                              DATE: 04/22/24  EVAL  Examination completed, findings reviewed, pt educated on POC, HEP, and male pelvic floor anatomy, reasoning with pelvic floor assessment internally with pt consent. Pt motivated to participate in PT and agreeable to attempt recommendations.  PATIENT EDUCATION:  Education details: Pt was educated on relevant anatomy, exam findings, home exercise program, plan of care, expectations of PT and fiber- will check with dietitian  Person educated: Patient and Spouse Education method: Explanation, Demonstration, Tactile cues, Verbal cues, and Handouts Education comprehension: verbalized understanding, returned demonstration, verbal cues required, tactile cues required, and needs further education  HOME EXERCISE PROGRAM: Access Code: [email or web address] URL:  [email or web address] Date: 04/22/2024 Prepared by: Cori Chandlar Guice  Exercises - Supine Butterfly Groin Stretch  - 1 x daily - 7 x weekly - 2 sets - 10 reps - Supine Lower Trunk Rotation  - 1 x daily - 7 x weekly - 2 sets - 10 reps - Diaphragmatic Breathing in Child's Pose with Pelvic Floor Relaxation  - 1 x daily - 7 x weekly - 2 sets - 10 reps - Butterfly Groin Stretch  - 1 x daily - 7 x weekly - 2 sets - 10 reps - Pelvic Floor Lengthening in Hooklying  - 1 x daily - 7 x weekly - 2 sets - 10 reps - Deep Squat with Pelvic Floor Relaxation  - 1 x daily - 7 x weekly - 2 sets - 10 reps - Quick Flick Pelvic Floor Contractions in Hooklying  - 1 x daily - 7 x weekly - 2 sets - 10 reps  Patient Education - Bowel Emptying Techniques - Abdominal Massage for Constipation - High-Fiber Diet to Support Pelvic Health - Bowel Emptying Techniques  ASSESSMENT:  CLINICAL IMPRESSION: Patient is a 59 y.o. M who was seen today for physical therapy evaluation and treatment for baseline for rectal cancer treatment. He has been getting chemotherapy and reported getting weaker. He has never had PT before. Wife present during eval today. He presents with decreased lift of pelvic floor checked rectally, able to lengthen, some tightness bilateral hamstrings, lumbar paraspinals, good strength bilateral hips, knees and upper extremities. 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 Discussed strategies that would belp with constipation, he has bristol stool scale 5-7 at times and more complete emptying. Patient was educated on role of PT in his recovery post radiation to be successfully able to return to work without fecal accidents and pain. He will benefit from PT to address deficits  OBJECTIVE IMPAIRMENTS: decreased coordination and decreased ROM.   ACTIVITY LIMITATIONS: continence and  toileting  PARTICIPATION LIMITATIONS: community activity and occupation  PERSONAL FACTORS: Profession are also affecting patient's functional outcome.   REHAB POTENTIAL: Good  CLINICAL DECISION MAKING: Evolving/moderate complexity  EVALUATION COMPLEXITY: Moderate   GOALS: Goals reviewed with patient? Yes  SHORT TERM GOALS: Target date: 05/20/2024    1.Pt will be independent with use of squatty potty, relaxed toileting mechanics, and improved bowel movement techniques in order to increase ease of bowel movements and complete evacuation.    Baseline: Goal status: INITIAL  2.  Patient will be educated on abdominal massage Baseline:  Goal status: INITIAL  3.   Pt will be I and consistent with her HEP and demonstrate all exercises correctly  Baseline:  Goal status: INITIAL    LONG TERM GOALS: Target date: 10/22/2023  Patient will report no fecal or urinary leakage Baseline:  Goal status: INITIAL  2.  Pt will have reduced  pain to max 1/10 in order to be able to do functional activities such as bending, lifting, twisting and walking as needed to be able to take care  of her family and participate in job duties.  Baseline:  Goal status: INITIAL  3.  Patient will have bristol stool scale 3-4  Baseline:  Goal status: INITIAL  4.  Patient will report no rectal pain Baseline:  Goal status: INITIAL  5.  Patient will be able to participate in work at least 8 hrs/ day without fecal accidents Baseline:  Goal status: INITIAL   PLAN:  PT FREQUENCY: 1-2x/week  PT DURATION: 6 months  PLANNED INTERVENTIONS: 97110-Therapeutic exercises, 97530- Therapeutic activity, 97112- Neuromuscular re-education, 97535- Self Care, 02859- Manual therapy, [phone]- Electrical stimulation (manual), [phone]- Ionotophoresis 4mg /ml Dexamethasone , 79439 (1-2 muscles), 20561 (3+ muscles)- Dry Needling, Patient/Family education, Taping, Joint mobilization, Joint manipulation, Spinal manipulation, Spinal  mobilization, Manual lymph drainage, Scar mobilization, Cryotherapy, Moist heat, and Biofeedback  PLAN FOR NEXT SESSION: reeval, abdominal massage education, review of bowel recommendations and routine, fiber   Kameryn Davern, PT 04/22/2024, 9:41 AM  "

## 2024-04-22 ENCOUNTER — Other Ambulatory Visit: Payer: Self-pay

## 2024-04-22 ENCOUNTER — Encounter: Payer: Self-pay | Admitting: Oncology

## 2024-04-22 ENCOUNTER — Other Ambulatory Visit (HOSPITAL_COMMUNITY): Payer: Self-pay

## 2024-04-22 ENCOUNTER — Telehealth: Payer: Self-pay

## 2024-04-22 ENCOUNTER — Other Ambulatory Visit: Payer: Self-pay | Admitting: Oncology

## 2024-04-22 ENCOUNTER — Encounter: Payer: Self-pay | Admitting: Physical Therapy

## 2024-04-22 ENCOUNTER — Ambulatory Visit
Admission: RE | Admit: 2024-04-22 | Discharge: 2024-04-22 | Disposition: A | Discharge status: Home / Self Care | Payer: Self-pay | Source: Ambulatory Visit | Attending: Radiation Oncology | Admitting: Radiation Oncology

## 2024-04-22 ENCOUNTER — Ambulatory Visit: Payer: Self-pay | Admitting: Physical Therapy

## 2024-04-22 DIAGNOSIS — C787 Secondary malignant neoplasm of liver and intrahepatic bile duct: Secondary | ICD-10-CM | POA: Insufficient documentation

## 2024-04-22 DIAGNOSIS — Z87891 Personal history of nicotine dependence: Secondary | ICD-10-CM | POA: Insufficient documentation

## 2024-04-22 DIAGNOSIS — F17210 Nicotine dependence, cigarettes, uncomplicated: Secondary | ICD-10-CM

## 2024-04-22 DIAGNOSIS — Z510 Encounter for antineoplastic radiation therapy: Secondary | ICD-10-CM | POA: Insufficient documentation

## 2024-04-22 DIAGNOSIS — R278 Other lack of coordination: Secondary | ICD-10-CM | POA: Insufficient documentation

## 2024-04-22 DIAGNOSIS — C20 Malignant neoplasm of rectum: Secondary | ICD-10-CM | POA: Insufficient documentation

## 2024-04-22 MED ORDER — NICOTINE 7 MG/24HR TD PT24: MEDICATED_PATCH | TRANSDERMAL | 0 refills | Status: AC

## 2024-04-22 MED ORDER — CAPECITABINE 500 MG PO TABS
1500.0000 mg | ORAL_TABLET | Freq: Two times a day (BID) | ORAL | 2 refills | Status: AC
  Filled 2024-04-23: qty 84, 14d supply, fill #0
  Filled 2024-04-30: qty 84, 14d supply, fill #1
  Filled 2024-05-13 – 2024-06-06 (×2): qty 84, 14d supply, fill #2

## 2024-04-22 NOTE — Telephone Encounter (Signed)
 GI Tumor Boards  [address] will be presented on the GI tumor board 04/23/2024. Robert Mitchell has a personal history of metastatic rectal cancer that has spread to the liver.  Robert Mitchell personal and family history is somewhat suspicious for a hereditary cancer syndrome. He has a family history of colon cancer in his maternal grandmother mother and maternal uncle. Additionally he has a paternal and maternal uncle each with prostate cancer.  Robert Fryer, MS, CGC  Certified Genetic Counselor  Email: [email or web address]  Phone: [phone]-

## 2024-04-22 NOTE — Telephone Encounter (Addendum)
 Pt's wife calling, states he starts radiation in [place] with Dr Dewey on 04/28/2024. They were told he would be taking oral chemo with this?!? Nothing ordered. Message sent to Dr Ezzard, Eleanor P,NP, Kaitlyn Schomburg,RPH and [place].

## 2024-04-23 ENCOUNTER — Other Ambulatory Visit: Payer: Self-pay | Admitting: *Deleted

## 2024-04-23 ENCOUNTER — Other Ambulatory Visit: Payer: Self-pay

## 2024-04-23 NOTE — Progress Notes (Signed)
 The proposed treatment discussed in conference is for discussion purpose only and is not a binding recommendation.  The patients have not been physically examined, or presented with their treatment options.  Therefore, final treatment plans cannot be decided.

## 2024-04-23 NOTE — Telephone Encounter (Addendum)
 Addendum: patient will not take Xeloda  on weekends or on days that patient does not have radiation per Dr. Ezzard after discussion with Amy. Amy stated that she has talked to patients wife.   Oral Oncology Pharmacist Encounter  Received new prescription for Xeloda  (capecitabine ) for the treatment of stage IVA rectal cancer in conjunction with radiation, planned duration includes duration of radiation therapy likely 5 and 1/2 to 6 weeks of therapy.  Labs from 04/03/24 (CBC and CMP) assessed, no interventions needed. Prescription dose and frequency assessed for appropriateness. Message sent to MD to clarify days patient will take medication to determine if patient will be off on weekends.   Current medication list in Epic reviewed, DDIs with Xeloda  identified: - pantoprazole : will discuss with patient the use of pantoprazole  as PPIs may decrease the efficacy of xeloda . Depending on indication, we will discuss the use of famotidine.   Evaluated chart and no patient barriers to medication adherence noted.   Prescription has been e-scribed to the [facility] for benefits analysis and approval.  Oral Oncology Clinic will continue to follow for insurance authorization, copayment issues, initial counseling and start date.  Oral Chemotherapy Pharmacist Encounter  I spoke with patient for overview of: Xeloda  for the treatment of stage IVA rectal cancer in conjunction with radiation, planned duration 5 1/2 - 6 weeks. Patient states that he is doing 28 days of radiation. Will clarify with MD upon return from the holidays. Patient and patients wife aware and clarification of days taking the medication will also be clarified.   Counseled patient on administration, dosing, side effects, monitoring, drug-food interactions, safe handling, storage, and disposal.  Patient will take Xeloda  500mg  tablets, 3 tablets (1500mg ) by mouth in AM and 3 tabs (1500mg ) by mouth in PM, within 30 minutes of  finishing meals, on days of radiation only.  Xeloda  and radiation start date: 04/28/2024  Adverse effects of Xeloda  include but are not limited to: fatigue, decreased blood counts, GI upset, diarrhea, mouth sores, and hand-foot syndrome.  Patient has anti-emetic on hand and knows to take it if nausea develops.  Patient will obtain anti diarrheal and alert the office of 4 or more loose stools above baseline. Reviewed with patient importance of keeping a medication schedule and plan for any missed doses. No barriers to medication adherence identified. Currently patient is not taking the pantoprazole . Notified patient that if he does have any acid reflux to take famotidine while taking the xeloda . Patient and patients wife agreed with the plan.   Medication reconciliation performed and medication/allergy list updated.  Distress thermometer not completed during telephone call as patient has been on previous lines of therapy.   Communication and Learning Assessment Primary learner: Patient and patients wife Barriers to learning: No barriers Preferred language: English Learning preferences: Listening Reading  All questions answered. Patient and patients wife voiced understanding and appreciation. Medication education handout placed in mail for patient. Patient knows to call the office with questions or concerns. Oral Chemotherapy Clinic phone number provided to patient.   Deziyah Arvin, PharmD Hematology/Oncology Clinical Pharmacist [facility] Oral Chemotherapy Navigation Clinic [phone] 04/23/2024 8:06 AM

## 2024-04-23 NOTE — Progress Notes (Signed)
 Specialty Pharmacy Initial Fill Coordination Note  Robert Mitchell is a 58 y.o. male contacted today regarding refills of specialty medication(s) Capecitabine  (XELODA ) .  Patient requested Delivery  on 04/25/24  to verified address [address]   [place] [address]   Medication will be filled on 04/23/24.   Patient is aware of $29.40 copayment.

## 2024-04-23 NOTE — Progress Notes (Signed)
 Patient counseled on Xeloda  in telephone encounter opened on 04/22/24.   Calen Posch, PharmD Hematology/Oncology Clinical Pharmacist [facility]otherapy [facility] [phone]

## 2024-04-28 ENCOUNTER — Ambulatory Visit
Admission: RE | Admit: 2024-04-28 | Discharge: 2024-04-28 | Disposition: A | Discharge status: Home / Self Care | Payer: Self-pay | Source: Ambulatory Visit | Attending: Radiation Oncology | Admitting: Radiation Oncology

## 2024-04-28 ENCOUNTER — Other Ambulatory Visit: Payer: Self-pay

## 2024-04-28 LAB — RAD ONC ARIA SESSION SUMMARY
Course Elapsed Days: 0
Plan Fractions Treated to Date: 1
Plan Prescribed Dose Per Fraction: 1.8 Gy
Plan Total Fractions Prescribed: 25
Plan Total Prescribed Dose: 45 Gy
Reference Point Dosage Given to Date: 1.8 Gy
Reference Point Session Dosage Given: 1.8 Gy
Session Number: 1

## 2024-04-29 ENCOUNTER — Ambulatory Visit
Admission: RE | Admit: 2024-04-29 | Discharge: 2024-04-29 | Disposition: A | Discharge status: Home / Self Care | Payer: Self-pay | Source: Ambulatory Visit | Attending: Radiation Oncology | Admitting: Radiation Oncology

## 2024-04-29 ENCOUNTER — Other Ambulatory Visit: Payer: Self-pay

## 2024-04-29 ENCOUNTER — Telehealth: Payer: Self-pay

## 2024-04-29 LAB — RAD ONC ARIA SESSION SUMMARY
Course Elapsed Days: 1
Plan Fractions Treated to Date: 2
Plan Prescribed Dose Per Fraction: 1.8 Gy
Plan Total Fractions Prescribed: 25
Plan Total Prescribed Dose: 45 Gy
Reference Point Dosage Given to Date: 3.6 Gy
Reference Point Session Dosage Given: 1.8 Gy
Session Number: 2

## 2024-04-29 NOTE — Telephone Encounter (Signed)
 Confirmed with Dr Ezzard, pt is NOT to take xeloda  on weekends or anyday not receiving radiation.

## 2024-04-30 ENCOUNTER — Other Ambulatory Visit (HOSPITAL_COMMUNITY): Payer: Self-pay

## 2024-04-30 ENCOUNTER — Ambulatory Visit
Admission: RE | Admit: 2024-04-30 | Discharge: 2024-04-30 | Disposition: A | Discharge status: Home / Self Care | Payer: Self-pay | Source: Ambulatory Visit | Attending: Radiation Oncology | Admitting: Radiation Oncology

## 2024-04-30 ENCOUNTER — Other Ambulatory Visit: Payer: Self-pay

## 2024-04-30 LAB — RAD ONC ARIA SESSION SUMMARY
Course Elapsed Days: 2
Plan Fractions Treated to Date: 3
Plan Prescribed Dose Per Fraction: 1.8 Gy
Plan Total Fractions Prescribed: 25
Plan Total Prescribed Dose: 45 Gy
Reference Point Dosage Given to Date: 5.4 Gy
Reference Point Session Dosage Given: 1.8 Gy
Session Number: 3

## 2024-05-02 ENCOUNTER — Other Ambulatory Visit (HOSPITAL_COMMUNITY): Payer: Self-pay

## 2024-05-02 ENCOUNTER — Other Ambulatory Visit: Payer: Self-pay

## 2024-05-02 ENCOUNTER — Ambulatory Visit
Admission: RE | Admit: 2024-05-02 | Discharge: 2024-05-02 | Disposition: A | Payer: Self-pay | Source: Ambulatory Visit | Attending: Radiation Oncology

## 2024-05-02 ENCOUNTER — Ambulatory Visit
Admission: RE | Admit: 2024-05-02 | Discharge: 2024-05-02 | Disposition: A | Discharge status: Home / Self Care | Payer: Self-pay | Source: Ambulatory Visit | Attending: Radiation Oncology | Admitting: Radiation Oncology

## 2024-05-02 DIAGNOSIS — Z87891 Personal history of nicotine dependence: Secondary | ICD-10-CM | POA: Insufficient documentation

## 2024-05-02 DIAGNOSIS — Z510 Encounter for antineoplastic radiation therapy: Secondary | ICD-10-CM | POA: Insufficient documentation

## 2024-05-02 DIAGNOSIS — C20 Malignant neoplasm of rectum: Secondary | ICD-10-CM | POA: Insufficient documentation

## 2024-05-02 DIAGNOSIS — C787 Secondary malignant neoplasm of liver and intrahepatic bile duct: Secondary | ICD-10-CM | POA: Insufficient documentation

## 2024-05-02 LAB — RAD ONC ARIA SESSION SUMMARY
Course Elapsed Days: 4
Plan Fractions Treated to Date: 4
Plan Prescribed Dose Per Fraction: 1.8 Gy
Plan Total Fractions Prescribed: 25
Plan Total Prescribed Dose: 45 Gy
Reference Point Dosage Given to Date: 7.2 Gy
Reference Point Session Dosage Given: 1.8 Gy
Session Number: 4

## 2024-05-02 NOTE — Progress Notes (Signed)
 Specialty Pharmacy Refill Coordination Note  Robert Mitchell is a 59 y.o. male contacted today regarding refills of specialty medication(s) Capecitabine  (XELODA )   Patient requested Delivery   Delivery date: 05/07/24   Verified address: [address]   [place] [address]   Medication will be filled on: 05/06/24

## 2024-05-02 NOTE — Progress Notes (Signed)
 Specialty Pharmacy Ongoing Clinical Assessment Note  Robert Mitchell is a 59 y.o. male who is being followed by the specialty pharmacy service for RxSp Oncology   Patient's specialty medication(s) reviewed today: Capecitabine  (XELODA )   Missed doses in the last 4 weeks: 1   Patient/Caregiver did not have any additional questions or concerns.   Therapeutic benefit summary: Unable to assess   Adverse events/side effects summary: Experienced adverse events/side effects (Burning/dryness in hands and feet - wife states they are applying a thick cream twice daily. Advised her to try diclofenac gel)   Patient's therapy is appropriate to: Continue    Goals Addressed             This Visit's Progress    Maintain optimal adherence to therapy   On track    Patient is on track. Patient will maintain adherence         Follow up: 3 months  [facility] Specialty Pharmacist

## 2024-05-05 ENCOUNTER — Ambulatory Visit: Admission: RE | Admit: 2024-05-05 | Discharge: 2024-05-05 | Payer: Self-pay | Attending: Radiation Oncology

## 2024-05-05 ENCOUNTER — Other Ambulatory Visit: Payer: Self-pay

## 2024-05-05 ENCOUNTER — Telehealth: Payer: Self-pay

## 2024-05-05 LAB — RAD ONC ARIA SESSION SUMMARY
Course Elapsed Days: 7
Plan Fractions Treated to Date: 5
Plan Prescribed Dose Per Fraction: 1.8 Gy
Plan Total Fractions Prescribed: 25
Plan Total Prescribed Dose: 45 Gy
Reference Point Dosage Given to Date: 9 Gy
Reference Point Session Dosage Given: 1.8 Gy
Session Number: 5

## 2024-05-05 NOTE — Telephone Encounter (Signed)
 Dr Romero ([facility]) saw Robert Mitchell in clinic on Friday, 05/02/2024. He would like to speak w/Dr Ezzard regarding pt;s tx plan. Dr Romero can be reached @ [phone].

## 2024-05-06 ENCOUNTER — Other Ambulatory Visit: Payer: Self-pay

## 2024-05-06 ENCOUNTER — Ambulatory Visit: Admission: RE | Admit: 2024-05-06 | Discharge: 2024-05-06 | Payer: Self-pay | Attending: Radiation Oncology

## 2024-05-06 LAB — RAD ONC ARIA SESSION SUMMARY
Course Elapsed Days: 8
Plan Fractions Treated to Date: 6
Plan Prescribed Dose Per Fraction: 1.8 Gy
Plan Total Fractions Prescribed: 25
Plan Total Prescribed Dose: 45 Gy
Reference Point Dosage Given to Date: 10.8 Gy
Reference Point Session Dosage Given: 1.8 Gy
Session Number: 6

## 2024-05-07 ENCOUNTER — Ambulatory Visit
Admission: RE | Admit: 2024-05-07 | Discharge: 2024-05-07 | Disposition: A | Discharge status: Home / Self Care | Payer: Self-pay | Source: Ambulatory Visit | Attending: Radiation Oncology | Admitting: Radiation Oncology

## 2024-05-07 ENCOUNTER — Other Ambulatory Visit: Payer: Self-pay

## 2024-05-07 ENCOUNTER — Telehealth: Payer: Self-pay

## 2024-05-07 LAB — RAD ONC ARIA SESSION SUMMARY
Course Elapsed Days: 9
Plan Fractions Treated to Date: 7
Plan Prescribed Dose Per Fraction: 1.8 Gy
Plan Total Fractions Prescribed: 25
Plan Total Prescribed Dose: 45 Gy
Reference Point Dosage Given to Date: 12.6 Gy
Reference Point Session Dosage Given: 1.8 Gy
Session Number: 7

## 2024-05-07 NOTE — Telephone Encounter (Signed)
 Pt is doing well per his wife. He has some sensitivity and little redness on his hands. He is applying cream to his hands. No missed doses of the Xeloda . See chemo f/u call flowsheet for assessment of symptoms/side effects. I reminded pt's wife to call us  if he were to develop temp of 100.4 or higher, day or night. She verbalized understanding.

## 2024-05-08 ENCOUNTER — Ambulatory Visit
Admission: RE | Admit: 2024-05-08 | Discharge: 2024-05-08 | Disposition: A | Discharge status: Home / Self Care | Payer: Self-pay | Source: Ambulatory Visit | Attending: Radiation Oncology | Admitting: Radiation Oncology

## 2024-05-08 ENCOUNTER — Encounter: Payer: Self-pay | Admitting: Oncology

## 2024-05-08 ENCOUNTER — Other Ambulatory Visit: Payer: Self-pay

## 2024-05-08 LAB — RAD ONC ARIA SESSION SUMMARY
Course Elapsed Days: 10
Plan Fractions Treated to Date: 8
Plan Prescribed Dose Per Fraction: 1.8 Gy
Plan Total Fractions Prescribed: 25
Plan Total Prescribed Dose: 45 Gy
Reference Point Dosage Given to Date: 14.4 Gy
Reference Point Session Dosage Given: 1.8 Gy
Session Number: 8

## 2024-05-09 ENCOUNTER — Other Ambulatory Visit: Payer: Self-pay

## 2024-05-09 ENCOUNTER — Ambulatory Visit
Admission: RE | Admit: 2024-05-09 | Discharge: 2024-05-09 | Disposition: A | Payer: Self-pay | Source: Ambulatory Visit | Attending: Radiation Oncology

## 2024-05-09 LAB — RAD ONC ARIA SESSION SUMMARY
Course Elapsed Days: 11
Plan Fractions Treated to Date: 9
Plan Prescribed Dose Per Fraction: 1.8 Gy
Plan Total Fractions Prescribed: 25
Plan Total Prescribed Dose: 45 Gy
Reference Point Dosage Given to Date: 16.2 Gy
Reference Point Session Dosage Given: 1.8 Gy
Session Number: 9

## 2024-05-12 ENCOUNTER — Other Ambulatory Visit: Payer: Self-pay

## 2024-05-12 ENCOUNTER — Ambulatory Visit
Admission: RE | Admit: 2024-05-12 | Discharge: 2024-05-12 | Disposition: A | Payer: Self-pay | Source: Ambulatory Visit | Attending: Radiation Oncology

## 2024-05-12 LAB — RAD ONC ARIA SESSION SUMMARY
Course Elapsed Days: 14
Plan Fractions Treated to Date: 10
Plan Prescribed Dose Per Fraction: 1.8 Gy
Plan Total Fractions Prescribed: 25
Plan Total Prescribed Dose: 45 Gy
Reference Point Dosage Given to Date: 18 Gy
Reference Point Session Dosage Given: 1.8 Gy
Session Number: 10

## 2024-05-13 ENCOUNTER — Other Ambulatory Visit: Payer: Self-pay

## 2024-05-13 ENCOUNTER — Ambulatory Visit
Admission: RE | Admit: 2024-05-13 | Discharge: 2024-05-13 | Disposition: A | Payer: Self-pay | Source: Ambulatory Visit | Attending: Radiation Oncology

## 2024-05-13 ENCOUNTER — Other Ambulatory Visit (HOSPITAL_COMMUNITY): Payer: Self-pay

## 2024-05-13 LAB — RAD ONC ARIA SESSION SUMMARY
Course Elapsed Days: 15
Plan Fractions Treated to Date: 11
Plan Prescribed Dose Per Fraction: 1.8 Gy
Plan Total Fractions Prescribed: 25
Plan Total Prescribed Dose: 45 Gy
Reference Point Dosage Given to Date: 19.8 Gy
Reference Point Session Dosage Given: 1.8 Gy
Session Number: 11

## 2024-05-14 ENCOUNTER — Ambulatory Visit: Admission: RE | Admit: 2024-05-14 | Discharge: 2024-05-14 | Payer: Self-pay | Attending: Radiation Oncology

## 2024-05-14 ENCOUNTER — Other Ambulatory Visit: Payer: Self-pay

## 2024-05-14 ENCOUNTER — Ambulatory Visit
Admission: RE | Admit: 2024-05-14 | Discharge: 2024-05-14 | Disposition: A | Discharge status: Home / Self Care | Payer: Self-pay | Source: Ambulatory Visit | Attending: Radiation Oncology | Admitting: Radiation Oncology

## 2024-05-14 LAB — RAD ONC ARIA SESSION SUMMARY
Course Elapsed Days: 16
Plan Fractions Treated to Date: 12
Plan Prescribed Dose Per Fraction: 1.8 Gy
Plan Total Fractions Prescribed: 25
Plan Total Prescribed Dose: 45 Gy
Reference Point Dosage Given to Date: 21.6 Gy
Reference Point Session Dosage Given: 1.8 Gy
Session Number: 12

## 2024-05-15 ENCOUNTER — Other Ambulatory Visit: Payer: Self-pay

## 2024-05-15 ENCOUNTER — Ambulatory Visit
Admission: RE | Admit: 2024-05-15 | Discharge: 2024-05-15 | Disposition: A | Payer: Self-pay | Source: Ambulatory Visit | Attending: Radiation Oncology

## 2024-05-15 LAB — RAD ONC ARIA SESSION SUMMARY
Course Elapsed Days: 17
Plan Fractions Treated to Date: 13
Plan Prescribed Dose Per Fraction: 1.8 Gy
Plan Total Fractions Prescribed: 25
Plan Total Prescribed Dose: 45 Gy
Reference Point Dosage Given to Date: 23.4 Gy
Reference Point Session Dosage Given: 1.8 Gy
Session Number: 13

## 2024-05-16 ENCOUNTER — Ambulatory Visit
Admission: RE | Admit: 2024-05-16 | Discharge: 2024-05-16 | Disposition: A | Discharge status: Home / Self Care | Payer: Self-pay | Source: Ambulatory Visit | Attending: Radiation Oncology | Admitting: Radiation Oncology

## 2024-05-16 ENCOUNTER — Other Ambulatory Visit: Payer: Self-pay

## 2024-05-16 LAB — RAD ONC ARIA SESSION SUMMARY
Course Elapsed Days: 18
Plan Fractions Treated to Date: 14
Plan Prescribed Dose Per Fraction: 1.8 Gy
Plan Total Fractions Prescribed: 25
Plan Total Prescribed Dose: 45 Gy
Reference Point Dosage Given to Date: 25.2 Gy
Reference Point Session Dosage Given: 1.8 Gy
Session Number: 14

## 2024-05-19 ENCOUNTER — Ambulatory Visit
Admission: RE | Admit: 2024-05-19 | Discharge: 2024-05-19 | Disposition: A | Payer: Self-pay | Source: Ambulatory Visit | Attending: Radiation Oncology

## 2024-05-19 ENCOUNTER — Other Ambulatory Visit: Payer: Self-pay

## 2024-05-19 ENCOUNTER — Telehealth: Payer: Self-pay

## 2024-05-19 LAB — RAD ONC ARIA SESSION SUMMARY
Course Elapsed Days: 21
Plan Fractions Treated to Date: 15
Plan Prescribed Dose Per Fraction: 1.8 Gy
Plan Total Fractions Prescribed: 25
Plan Total Prescribed Dose: 45 Gy
Reference Point Dosage Given to Date: 27 Gy
Reference Point Session Dosage Given: 1.8 Gy
Session Number: 15

## 2024-05-20 ENCOUNTER — Other Ambulatory Visit: Payer: Self-pay

## 2024-05-20 ENCOUNTER — Ambulatory Visit
Admission: RE | Admit: 2024-05-20 | Discharge: 2024-05-20 | Disposition: A | Payer: Self-pay | Source: Ambulatory Visit | Attending: Radiation Oncology

## 2024-05-20 LAB — RAD ONC ARIA SESSION SUMMARY
Course Elapsed Days: 22
Plan Fractions Treated to Date: 16
Plan Prescribed Dose Per Fraction: 1.8 Gy
Plan Total Fractions Prescribed: 25
Plan Total Prescribed Dose: 45 Gy
Reference Point Dosage Given to Date: 28.8 Gy
Reference Point Session Dosage Given: 1.8 Gy
Session Number: 16

## 2024-05-21 ENCOUNTER — Other Ambulatory Visit: Payer: Self-pay

## 2024-05-21 ENCOUNTER — Ambulatory Visit
Admission: RE | Admit: 2024-05-21 | Discharge: 2024-05-21 | Disposition: A | Payer: Self-pay | Source: Ambulatory Visit | Attending: Radiation Oncology

## 2024-05-21 ENCOUNTER — Encounter: Payer: Self-pay | Admitting: Oncology

## 2024-05-21 LAB — RAD ONC ARIA SESSION SUMMARY
Course Elapsed Days: 23
Plan Fractions Treated to Date: 17
Plan Prescribed Dose Per Fraction: 1.8 Gy
Plan Total Fractions Prescribed: 25
Plan Total Prescribed Dose: 45 Gy
Reference Point Dosage Given to Date: 30.6 Gy
Reference Point Session Dosage Given: 1.8 Gy
Session Number: 17

## 2024-05-21 NOTE — Telephone Encounter (Signed)
 I spoke with Robert Mitchell. She states that Robert Mitchell doesn't really have any pain. He feels pressure when standing for a period of time. He is taking the Xeloda  between 7a-730a, and again @ 6pm. No missed doses. He only takes the Xeloda  on days he receives radiation. No PPE - he continues to apply cream/lotion to his hands to prevent issues. See chemo f/u call flowsheet for further symptom assessment. He tends to have some constipation, rather than diarrhea. He is using Miralax BID to keep bowels moving. I reminded her to call us  if he develops temp of 100.4 or higher, day or night. She verbalized understanding.

## 2024-05-22 ENCOUNTER — Other Ambulatory Visit: Payer: Self-pay | Admitting: Oncology

## 2024-05-22 ENCOUNTER — Other Ambulatory Visit: Payer: Self-pay

## 2024-05-22 ENCOUNTER — Inpatient Hospital Stay: Payer: Self-pay | Attending: Oncology

## 2024-05-22 ENCOUNTER — Inpatient Hospital Stay: Payer: Self-pay | Admitting: Oncology

## 2024-05-22 ENCOUNTER — Inpatient Hospital Stay: Payer: Self-pay

## 2024-05-22 ENCOUNTER — Encounter: Payer: Self-pay | Admitting: Oncology

## 2024-05-22 ENCOUNTER — Ambulatory Visit
Admission: RE | Admit: 2024-05-22 | Discharge: 2024-05-22 | Disposition: A | Discharge status: Home / Self Care | Payer: Self-pay | Source: Ambulatory Visit | Attending: Radiation Oncology | Admitting: Radiation Oncology

## 2024-05-22 VITALS — BP 139/75 | HR 63 | Temp 98.2°F | Resp 16 | Ht 67.0 in | Wt 182.6 lb

## 2024-05-22 DIAGNOSIS — C787 Secondary malignant neoplasm of liver and intrahepatic bile duct: Secondary | ICD-10-CM | POA: Insufficient documentation

## 2024-05-22 DIAGNOSIS — C20 Malignant neoplasm of rectum: Secondary | ICD-10-CM

## 2024-05-22 LAB — CMP (CANCER CENTER ONLY)
ALT: 20 U/L (ref 0–44)
AST: 26 U/L (ref 15–41)
Albumin: 3.9 g/dL (ref 3.5–5.0)
Alkaline Phosphatase: 88 U/L (ref 38–126)
Anion gap: 9 (ref 5–15)
BUN: 18 mg/dL (ref 6–20)
CO2: 26 mmol/L (ref 22–32)
Calcium: 9.4 mg/dL (ref 8.9–10.3)
Chloride: 103 mmol/L (ref 98–111)
Creatinine: 1.06 mg/dL (ref 0.61–1.24)
GFR, Estimated: >60 mL/min
Glucose, Bld: 95 mg/dL (ref 70–99)
Potassium: 4.2 mmol/L (ref 3.5–5.1)
Sodium: 138 mmol/L (ref 135–145)
Total Bilirubin: 0.5 mg/dL (ref 0.0–1.2)
Total Protein: 6.9 g/dL (ref 6.5–8.1)

## 2024-05-22 LAB — CBC WITH DIFFERENTIAL (CANCER CENTER ONLY)
Abs Immature Granulocytes: 0.01 K/uL (ref 0.00–0.07)
Basophils Absolute: 0 K/uL (ref 0.0–0.1)
Basophils Relative: 0 %
Eosinophils Absolute: 0.1 K/uL (ref 0.0–0.5)
Eosinophils Relative: 3 %
HCT: 37.5 % — ABNORMAL LOW (ref 39.0–52.0)
Hemoglobin: 12.7 g/dL — ABNORMAL LOW (ref 13.0–17.0)
Immature Granulocytes: 0 %
Lymphocytes Relative: 18 %
Lymphs Abs: 0.6 K/uL — ABNORMAL LOW (ref 0.7–4.0)
MCH: 33.6 pg (ref 26.0–34.0)
MCHC: 33.9 g/dL (ref 30.0–36.0)
MCV: 99.2 fL (ref 80.0–100.0)
Monocytes Absolute: 0.4 K/uL (ref 0.1–1.0)
Monocytes Relative: 13 %
Neutro Abs: 2 K/uL (ref 1.7–7.7)
Neutrophils Relative %: 66 %
Platelet Count: 161 K/uL (ref 150–400)
RBC: 3.78 MIL/uL — ABNORMAL LOW (ref 4.22–5.81)
RDW: 16.4 % — ABNORMAL HIGH (ref 11.5–15.5)
WBC Count: 3 K/uL — ABNORMAL LOW (ref 4.0–10.5)
nRBC: 0 % (ref 0.0–0.2)

## 2024-05-22 LAB — RAD ONC ARIA SESSION SUMMARY
Course Elapsed Days: 24
Plan Fractions Treated to Date: 18
Plan Prescribed Dose Per Fraction: 1.8 Gy
Plan Total Fractions Prescribed: 25
Plan Total Prescribed Dose: 45 Gy
Reference Point Dosage Given to Date: 32.4 Gy
Reference Point Session Dosage Given: 1.8 Gy
Session Number: 18

## 2024-05-22 LAB — CEA (ACCESS): CEA (CHCC): 32.46 ng/mL — ABNORMAL HIGH (ref 0.00–5.00)

## 2024-05-22 NOTE — Progress Notes (Addendum)
 " [facility] at [facility] [address] [place],  [place]  [address] [phone]  Clinic Day:  05/22/2024  Referring physician: Silver Lamar LABOR, MD   HISTORY OF PRESENT ILLNESS:  The patient is a 59 y.o. male with stage IVA (T3 N2a M1a ; MSS stable ; KRAS mutation positive) rectal cancer, which includes 1 metastatic liver lesion.  The patient completed 6 cycles of  FOLFOX chemotherapy in late November 2025.  Although his PET scan after his 6 cycles of FOLFOX showed increased growth, but no hypermetabolic activity with his metastatic liver lesion, CT scans since then have shown progressive enlargement of this lesion to where it appears systemic disease control was not achieved with  FOLFOX.  The patient is currently undergoing chemoradiation with Xeloda  for local control of his primary rectal cancer.  He is scheduled to complete his chemoradiation on February 5th.  Overall, the patient claims to be doing fairly well.  In the recent past, the patient had right upper quadrant abdominal pain, which concerned him for the possibility of his liver lesion symptomatically getting worse.  However, since then, he has no longer had this discomfort.  He holds aside hope that the dissipation of this symptom is a sign of a positive disease response.  He denies having other symptoms/findings which concern him for overt signs of disease progression.  PHYSICAL EXAM:  Blood pressure 139/75, pulse 63, temperature 98.2 F (36.8 C), temperature source Oral, resp. rate 16, height 5' 7 (1.702 m), weight 182 lb 9.6 oz (82.8 kg), SpO2 98%. Wt Readings from Last 3 Encounters:  05/22/24 182 lb 9.6 oz (82.8 kg)  04/17/24 174 lb (78.9 kg)  04/10/24 173 lb 14.4 oz (78.9 kg)   Body mass index is 28.6 kg/m. Performance status (ECOG): 1 - Symptomatic but completely ambulatory Physical Exam Constitutional:      Appearance: Normal appearance. He is not ill-appearing.  HENT:     Mouth/Throat:      Mouth: Mucous membranes are moist.     Pharynx: Oropharynx is clear. No oropharyngeal exudate or posterior oropharyngeal erythema.  Cardiovascular:     Rate and Rhythm: Normal rate and regular rhythm.     Heart sounds: No murmur heard.    No friction rub. No gallop.  Pulmonary:     Effort: Pulmonary effort is normal. No respiratory distress.     Breath sounds: Normal breath sounds. No wheezing, rhonchi or rales.  Abdominal:     General: Bowel sounds are normal. There is no distension.     Palpations: Abdomen is soft. There is no mass.     Tenderness: There is no abdominal tenderness.  Musculoskeletal:        General: No swelling.     Right lower leg: No edema.     Left lower leg: No edema.  Lymphadenopathy:     Cervical: No cervical adenopathy.     Upper Body:     Right upper body: No supraclavicular or axillary adenopathy.     Left upper body: No supraclavicular or axillary adenopathy.     Lower Body: No right inguinal adenopathy. No left inguinal adenopathy.  Skin:    General: Skin is warm.     Coloration: Skin is not jaundiced.     Findings: No lesion or rash.  Neurological:     General: No focal deficit present.     Mental Status: He is alert and oriented to person, place, and time. Mental status is at baseline.  Psychiatric:        Mood and Affect: Mood normal.        Behavior: Behavior normal.        Thought Content: Thought content normal.    LABS:      Latest Ref Rng & Units 05/22/2024   10:22 AM 04/03/2024   11:24 AM 03/24/2024    9:06 AM  CBC  WBC 4.0 - 10.5 K/uL 3.0  2.1  3.8   Hemoglobin 13.0 - 17.0 g/dL 87.2  88.2  87.1   Hematocrit 39.0 - 52.0 % 37.5  35.4  38.6   Platelets 150 - 400 K/uL 161  126  140       Latest Ref Rng & Units 05/22/2024   10:22 AM 04/03/2024   11:24 AM 03/24/2024    9:06 AM  CMP  Glucose 70 - 99 mg/dL 95  881  875   BUN 6 - 20 mg/dL 18  10  14    Creatinine 0.61 - 1.24 mg/dL 8.93  8.79  9.00   Sodium 135 - 145 mmol/L 138  137   139   Potassium 3.5 - 5.1 mmol/L 4.2  3.3  3.9   Chloride 98 - 111 mmol/L 103  104  106   CO2 22 - 32 mmol/L 26  23  24    Calcium  8.9 - 10.3 mg/dL 9.4  9.2  9.4   Total Protein 6.5 - 8.1 g/dL 6.9  6.6  7.0   Total Bilirubin 0.0 - 1.2 mg/dL 0.5  0.4  0.5   Alkaline Phos 38 - 126 U/L 88  94  111   AST 15 - 41 U/L 26  42  36   ALT 0 - 44 U/L 20  31  31     Lab Results  Component Value Date   CEA 32.92 (H) 02/11/2024   CEA 6.9 (H) 11/27/2023   PATHOLOGY (11-27-23): FINAL DIAGNOSIS        1. Duodenum, Biopsy,  :       - DUODENAL MUCOSA WITH NO SPECIFIC HISTOPATHOLOGIC CHANGES       - NEGATIVE FOR INCREASED INTRAEPITHELIAL LYMPHOCYTES OR VILLOUS ARCHITECTURAL       CHANGES        2. 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 Ascending  Colon Polyp, x 1 :       - TUBULAR ADENOMA(S)       - NEGATIVE FOR HIGH-GRADE DYSPLASIA OR MALIGNANCY        4. Rectum, biopsy, circumferential apple core mass :       - INVASIVE MODERATELY DIFFERENTIATED ADENOCARCINOMA, SEE COMMENT   ASSESSMENT & PLAN:  A 59 y.o. male with stage IVA (T3 N2a M1a ; MSS stable ; KRAS mutation positive)  rectal cancer.  As mentioned previously, this patient is undergoing chemoradiation with Xeloda , which is scheduled to finish on February 5th.  Afterwards, the plan will be to have him undergo repeat CT scans and labs on February 19th, with me to see him back the following day to go over the these results and their implications.  Ultimately, the plan is for the patient to receive 6 cycles of FOLFIRI/Avastin, with the first cycle to commence on Monday, February 23rd.  The goal remains to ascertain some type of systemic disease control to where surgical resection of his metastatic liver lesion can be accomplished,  in conjunction with the removal of his primary rectal mass, to make him NED.  I will make all parties aware of his scan and lab results as soon as they become  available.  The patient and his wife understand all the plans discussed today and are in agreement with them.  Vineta Carone DELENA Kerns, MD       "

## 2024-05-23 ENCOUNTER — Ambulatory Visit
Admission: RE | Admit: 2024-05-23 | Discharge: 2024-05-23 | Disposition: A | Payer: Self-pay | Source: Ambulatory Visit | Attending: Radiation Oncology

## 2024-05-23 ENCOUNTER — Other Ambulatory Visit: Payer: Self-pay

## 2024-05-23 LAB — RAD ONC ARIA SESSION SUMMARY
Course Elapsed Days: 25
Plan Fractions Treated to Date: 19
Plan Prescribed Dose Per Fraction: 1.8 Gy
Plan Total Fractions Prescribed: 25
Plan Total Prescribed Dose: 45 Gy
Reference Point Dosage Given to Date: 34.2 Gy
Reference Point Session Dosage Given: 1.8 Gy
Session Number: 19

## 2024-05-24 ENCOUNTER — Other Ambulatory Visit: Payer: Self-pay

## 2024-05-26 ENCOUNTER — Other Ambulatory Visit: Payer: Self-pay

## 2024-05-26 ENCOUNTER — Ambulatory Visit: Payer: Self-pay

## 2024-05-27 ENCOUNTER — Ambulatory Visit: Payer: Self-pay

## 2024-05-27 ENCOUNTER — Encounter: Payer: Self-pay | Admitting: Oncology

## 2024-05-28 ENCOUNTER — Other Ambulatory Visit: Payer: Self-pay

## 2024-05-28 ENCOUNTER — Ambulatory Visit
Admission: RE | Admit: 2024-05-28 | Discharge: 2024-05-28 | Disposition: A | Payer: Self-pay | Source: Ambulatory Visit | Attending: Radiation Oncology

## 2024-05-28 ENCOUNTER — Other Ambulatory Visit: Payer: Self-pay | Admitting: Physician Assistant

## 2024-05-28 LAB — RAD ONC ARIA SESSION SUMMARY
Course Elapsed Days: 30
Plan Fractions Treated to Date: 20
Plan Prescribed Dose Per Fraction: 1.8 Gy
Plan Total Fractions Prescribed: 25
Plan Total Prescribed Dose: 45 Gy
Reference Point Dosage Given to Date: 36 Gy
Reference Point Session Dosage Given: 1.8 Gy
Session Number: 20

## 2024-05-29 ENCOUNTER — Ambulatory Visit
Admission: RE | Admit: 2024-05-29 | Discharge: 2024-05-29 | Disposition: A | Discharge status: Home / Self Care | Payer: Self-pay | Source: Ambulatory Visit | Attending: Radiation Oncology | Admitting: Radiation Oncology

## 2024-05-29 ENCOUNTER — Other Ambulatory Visit: Payer: Self-pay

## 2024-05-29 LAB — RAD ONC ARIA SESSION SUMMARY
Course Elapsed Days: 31
Plan Fractions Treated to Date: 21
Plan Prescribed Dose Per Fraction: 1.8 Gy
Plan Total Fractions Prescribed: 25
Plan Total Prescribed Dose: 45 Gy
Reference Point Dosage Given to Date: 37.8 Gy
Reference Point Session Dosage Given: 1.8 Gy
Session Number: 21

## 2024-05-30 ENCOUNTER — Ambulatory Visit
Admission: RE | Admit: 2024-05-30 | Discharge: 2024-05-30 | Disposition: A | Payer: Self-pay | Source: Ambulatory Visit | Attending: Radiation Oncology

## 2024-05-30 ENCOUNTER — Other Ambulatory Visit: Payer: Self-pay

## 2024-05-30 ENCOUNTER — Encounter: Payer: Self-pay | Admitting: Oncology

## 2024-05-30 LAB — RAD ONC ARIA SESSION SUMMARY
Course Elapsed Days: 32
Plan Fractions Treated to Date: 22
Plan Prescribed Dose Per Fraction: 1.8 Gy
Plan Total Fractions Prescribed: 25
Plan Total Prescribed Dose: 45 Gy
Reference Point Dosage Given to Date: 39.6 Gy
Reference Point Session Dosage Given: 1.8 Gy
Session Number: 22

## 2024-06-02 ENCOUNTER — Ambulatory Visit: Payer: Self-pay

## 2024-06-03 ENCOUNTER — Ambulatory Visit
Admission: RE | Admit: 2024-06-03 | Discharge: 2024-06-03 | Disposition: A | Payer: Self-pay | Source: Ambulatory Visit | Attending: Radiation Oncology

## 2024-06-03 ENCOUNTER — Other Ambulatory Visit: Payer: Self-pay

## 2024-06-03 LAB — RAD ONC ARIA SESSION SUMMARY
Course Elapsed Days: 36
Plan Fractions Treated to Date: 23
Plan Prescribed Dose Per Fraction: 1.8 Gy
Plan Total Fractions Prescribed: 25
Plan Total Prescribed Dose: 45 Gy
Reference Point Dosage Given to Date: 41.4 Gy
Reference Point Session Dosage Given: 1.8 Gy
Session Number: 23

## 2024-06-04 ENCOUNTER — Other Ambulatory Visit: Payer: Self-pay

## 2024-06-04 ENCOUNTER — Ambulatory Visit: Payer: Self-pay

## 2024-06-04 ENCOUNTER — Ambulatory Visit
Admission: RE | Admit: 2024-06-04 | Discharge: 2024-06-04 | Disposition: A | Payer: Self-pay | Source: Ambulatory Visit | Attending: Radiation Oncology

## 2024-06-04 LAB — RAD ONC ARIA SESSION SUMMARY
Course Elapsed Days: 37
Plan Fractions Treated to Date: 24
Plan Prescribed Dose Per Fraction: 1.8 Gy
Plan Total Fractions Prescribed: 25
Plan Total Prescribed Dose: 45 Gy
Reference Point Dosage Given to Date: 43.2 Gy
Reference Point Session Dosage Given: 1.8 Gy
Session Number: 24

## 2024-06-05 ENCOUNTER — Ambulatory Visit: Payer: Self-pay

## 2024-06-05 ENCOUNTER — Ambulatory Visit: Admission: RE | Admit: 2024-06-05 | Discharge: 2024-06-05 | Payer: Self-pay | Attending: Radiation Oncology

## 2024-06-05 ENCOUNTER — Other Ambulatory Visit: Payer: Self-pay

## 2024-06-05 LAB — RAD ONC ARIA SESSION SUMMARY
Course Elapsed Days: 38
Plan Fractions Treated to Date: 25
Plan Prescribed Dose Per Fraction: 1.8 Gy
Plan Total Fractions Prescribed: 25
Plan Total Prescribed Dose: 45 Gy
Reference Point Dosage Given to Date: 45 Gy
Reference Point Session Dosage Given: 1.8 Gy
Session Number: 25

## 2024-06-06 ENCOUNTER — Ambulatory Visit: Payer: Self-pay

## 2024-06-06 ENCOUNTER — Other Ambulatory Visit: Payer: Self-pay

## 2024-06-06 ENCOUNTER — Telehealth: Payer: Self-pay

## 2024-06-06 ENCOUNTER — Ambulatory Visit: Admission: RE | Admit: 2024-06-06 | Payer: Self-pay

## 2024-06-06 LAB — RAD ONC ARIA SESSION SUMMARY
Course Elapsed Days: 39
Plan Fractions Treated to Date: 1
Plan Prescribed Dose Per Fraction: 1.8 Gy
Plan Total Fractions Prescribed: 3
Plan Total Prescribed Dose: 5.4 Gy
Reference Point Dosage Given to Date: 1.8 Gy
Reference Point Session Dosage Given: 1.8 Gy
Session Number: 26

## 2024-06-06 NOTE — Telephone Encounter (Signed)
 I spoke with Robert Mitchell. He has 2 more days of the Xeloda  (2/9/ & 06/10/2024), as 2 radiation tx's delayed due to weather conditions. No missed doses. He states he is doing pretty well. By end of day, he is tired/fatigued (See chemo f/u call flowsheet for further symptom assessment). He tends to be constipated, but is taking stool softeners and Miralax to keep him regular. Robert Mitchell reminded to call us  if he develop temp of 100.4 or higher, day or night.

## 2024-06-09 ENCOUNTER — Ambulatory Visit: Payer: Self-pay

## 2024-06-10 ENCOUNTER — Ambulatory Visit: Payer: Self-pay

## 2024-06-23 ENCOUNTER — Inpatient Hospital Stay: Payer: Self-pay

## 2024-06-23 ENCOUNTER — Inpatient Hospital Stay: Payer: Self-pay | Admitting: Oncology

## 2024-06-25 ENCOUNTER — Other Ambulatory Visit (HOSPITAL_BASED_OUTPATIENT_CLINIC_OR_DEPARTMENT_OTHER): Payer: Self-pay | Admitting: Radiology
